# Patient Record
Sex: Female | Born: 1960 | Race: White | Hispanic: No | Marital: Single | Smoking: Never smoker
Health system: Southern US, Community
[De-identification: ages and names within clinical notes are randomized; demographics above are authoritative.]

## PROBLEM LIST (undated history)

## (undated) DIAGNOSIS — M47817 Spondylosis without myelopathy or radiculopathy, lumbosacral region: Secondary | ICD-10-CM

## (undated) DIAGNOSIS — M719 Bursopathy, unspecified: Secondary | ICD-10-CM

## (undated) DIAGNOSIS — J302 Other seasonal allergic rhinitis: Secondary | ICD-10-CM

## (undated) DIAGNOSIS — M961 Postlaminectomy syndrome, not elsewhere classified: Secondary | ICD-10-CM

## (undated) DIAGNOSIS — M51369 Other intervertebral disc degeneration, lumbar region without mention of lumbar back pain or lower extremity pain: Secondary | ICD-10-CM

## (undated) DIAGNOSIS — G43909 Migraine, unspecified, not intractable, without status migrainosus: Secondary | ICD-10-CM

## (undated) DIAGNOSIS — M5136 Other intervertebral disc degeneration, lumbar region: Secondary | ICD-10-CM

## (undated) DIAGNOSIS — G894 Chronic pain syndrome: Secondary | ICD-10-CM

## (undated) DIAGNOSIS — G8929 Other chronic pain: Secondary | ICD-10-CM

## (undated) DIAGNOSIS — M75102 Unspecified rotator cuff tear or rupture of left shoulder, not specified as traumatic: Secondary | ICD-10-CM

## (undated) HISTORY — PX: APPENDECTOMY: SHX54

## (undated) HISTORY — PX: ABDOMINAL HYSTERECTOMY: SHX81

## (undated) HISTORY — DX: Other intervertebral disc degeneration, lumbar region: M51.36

## (undated) HISTORY — DX: Unspecified rotator cuff tear or rupture of left shoulder, not specified as traumatic: M75.102

## (undated) HISTORY — DX: Other intervertebral disc degeneration, lumbar region without mention of lumbar back pain or lower extremity pain: M51.369

## (undated) HISTORY — DX: Postlaminectomy syndrome, not elsewhere classified: M96.1

## (undated) HISTORY — DX: Chronic pain syndrome: G89.4

## (undated) HISTORY — DX: Spondylosis without myelopathy or radiculopathy, lumbosacral region: M47.817

## (undated) HISTORY — DX: Bursopathy, unspecified: M71.9

## (undated) HISTORY — PX: REFRACTIVE SURGERY: SHX103

## (undated) HISTORY — DX: Other seasonal allergic rhinitis: J30.2

## (undated) HISTORY — PX: WISDOM TOOTH EXTRACTION: SHX21

## (undated) HISTORY — PX: NASAL SEPTUM SURGERY: SHX37

## (undated) HISTORY — PX: TUMOR REMOVAL: SHX12

## (undated) HISTORY — DX: Migraine, unspecified, not intractable, without status migrainosus: G43.909

## (undated) HISTORY — DX: Other chronic pain: G89.29

---

## 1999-07-14 ENCOUNTER — Encounter: Admission: RE | Admit: 1999-07-14 | Discharge: 1999-07-23 | Payer: Self-pay | Admitting: Occupational Medicine

## 2000-02-17 ENCOUNTER — Encounter: Payer: Self-pay | Admitting: Obstetrics and Gynecology

## 2000-02-17 ENCOUNTER — Ambulatory Visit (HOSPITAL_COMMUNITY): Admission: RE | Admit: 2000-02-17 | Discharge: 2000-02-17 | Payer: Self-pay | Admitting: Obstetrics and Gynecology

## 2000-03-17 ENCOUNTER — Ambulatory Visit (HOSPITAL_COMMUNITY): Admission: RE | Admit: 2000-03-17 | Discharge: 2000-03-17 | Payer: Self-pay | Admitting: Gastroenterology

## 2000-05-11 ENCOUNTER — Ambulatory Visit (HOSPITAL_BASED_OUTPATIENT_CLINIC_OR_DEPARTMENT_OTHER): Admission: RE | Admit: 2000-05-11 | Discharge: 2000-05-11 | Payer: Self-pay | Admitting: Allergy and Immunology

## 2001-02-09 HISTORY — PX: GANGLION CYST EXCISION: SHX1691

## 2001-02-22 ENCOUNTER — Ambulatory Visit (HOSPITAL_COMMUNITY): Admission: RE | Admit: 2001-02-22 | Discharge: 2001-02-22 | Payer: Self-pay | Admitting: Obstetrics and Gynecology

## 2001-02-22 ENCOUNTER — Encounter: Payer: Self-pay | Admitting: Obstetrics and Gynecology

## 2001-12-11 ENCOUNTER — Encounter: Payer: Self-pay | Admitting: Emergency Medicine

## 2001-12-11 ENCOUNTER — Encounter (INDEPENDENT_AMBULATORY_CARE_PROVIDER_SITE_OTHER): Payer: Self-pay | Admitting: *Deleted

## 2001-12-11 ENCOUNTER — Inpatient Hospital Stay (HOSPITAL_COMMUNITY): Admission: EM | Admit: 2001-12-11 | Discharge: 2001-12-13 | Payer: Self-pay | Admitting: Emergency Medicine

## 2002-10-30 ENCOUNTER — Ambulatory Visit (HOSPITAL_COMMUNITY): Admission: RE | Admit: 2002-10-30 | Discharge: 2002-10-30 | Payer: Self-pay | Admitting: Orthopedic Surgery

## 2002-10-30 ENCOUNTER — Encounter: Payer: Self-pay | Admitting: Orthopedic Surgery

## 2002-11-30 ENCOUNTER — Ambulatory Visit (HOSPITAL_BASED_OUTPATIENT_CLINIC_OR_DEPARTMENT_OTHER): Admission: RE | Admit: 2002-11-30 | Discharge: 2002-11-30 | Payer: Self-pay | Admitting: Orthopedic Surgery

## 2004-09-22 ENCOUNTER — Encounter: Admission: RE | Admit: 2004-09-22 | Discharge: 2004-09-22 | Payer: Self-pay | Admitting: Otolaryngology

## 2005-03-12 ENCOUNTER — Other Ambulatory Visit: Admission: RE | Admit: 2005-03-12 | Discharge: 2005-03-12 | Payer: Self-pay | Admitting: Obstetrics and Gynecology

## 2005-03-26 ENCOUNTER — Ambulatory Visit (HOSPITAL_COMMUNITY): Admission: RE | Admit: 2005-03-26 | Discharge: 2005-03-26 | Payer: Self-pay | Admitting: Obstetrics and Gynecology

## 2006-05-27 ENCOUNTER — Ambulatory Visit (HOSPITAL_COMMUNITY): Admission: RE | Admit: 2006-05-27 | Discharge: 2006-05-27 | Payer: Self-pay | Admitting: Obstetrics and Gynecology

## 2006-12-07 ENCOUNTER — Encounter (INDEPENDENT_AMBULATORY_CARE_PROVIDER_SITE_OTHER): Payer: Self-pay | Admitting: Obstetrics and Gynecology

## 2006-12-07 ENCOUNTER — Ambulatory Visit (HOSPITAL_COMMUNITY): Admission: RE | Admit: 2006-12-07 | Discharge: 2006-12-08 | Payer: Self-pay | Admitting: Obstetrics and Gynecology

## 2008-02-10 HISTORY — PX: LUMBAR LAMINECTOMY: SHX95

## 2008-03-16 ENCOUNTER — Encounter: Admission: RE | Admit: 2008-03-16 | Discharge: 2008-03-16 | Payer: Self-pay | Admitting: Family Medicine

## 2008-04-23 ENCOUNTER — Inpatient Hospital Stay (HOSPITAL_COMMUNITY): Admission: RE | Admit: 2008-04-23 | Discharge: 2008-04-27 | Payer: Self-pay | Admitting: Neurosurgery

## 2008-09-08 ENCOUNTER — Emergency Department (HOSPITAL_COMMUNITY): Admission: EM | Admit: 2008-09-08 | Discharge: 2008-09-08 | Payer: Self-pay | Admitting: Emergency Medicine

## 2009-02-03 ENCOUNTER — Emergency Department (HOSPITAL_COMMUNITY): Admission: EM | Admit: 2009-02-03 | Discharge: 2009-02-03 | Payer: Self-pay | Admitting: Emergency Medicine

## 2010-01-21 ENCOUNTER — Encounter
Admission: RE | Admit: 2010-01-21 | Discharge: 2010-01-21 | Payer: Self-pay | Source: Home / Self Care | Attending: Unknown Physician Specialty | Admitting: Unknown Physician Specialty

## 2010-05-22 LAB — CBC
Hemoglobin: 11.7 g/dL — ABNORMAL LOW (ref 12.0–15.0)
MCHC: 35.5 g/dL (ref 30.0–36.0)
MCHC: 34.9 g/dL (ref 30.0–36.0)
MCV: 89.6 fL (ref 78.0–100.0)
MCV: 90 fL (ref 78.0–100.0)
Platelets: 260 10*3/uL (ref 150–400)
RBC: 3.67 MIL/uL — ABNORMAL LOW (ref 3.87–5.11)
RDW: 12 % (ref 11.5–15.5)

## 2010-05-22 LAB — BASIC METABOLIC PANEL
BUN: 10 mg/dL (ref 6–23)
CO2: 32 mEq/L (ref 19–32)
Calcium: 9.2 mg/dL (ref 8.4–10.5)
Glucose, Bld: 120 mg/dL — ABNORMAL HIGH (ref 70–99)
Potassium: 4 mEq/L (ref 3.5–5.1)
Sodium: 139 mEq/L (ref 135–145)

## 2010-05-22 LAB — TYPE AND SCREEN: ABO/RH(D): A POS

## 2010-06-24 NOTE — Op Note (Signed)
NAME:  Tracy Fox, Tracy Fox                    ACCOUNT NO.:  [id number]   MEDICAL RECORD NO.:  [id number]          PATIENT TYPE:  INP   LOCATION:  3034                         FACILITY:  [facility]   PHYSICIAN:  Cristi Loron, M.D.DATE OF BIRTH:  23-Jan-1961   DATE OF PROCEDURE:  04/23/2008  DATE OF DISCHARGE:                               OPERATIVE REPORT   BRIEF HISTORY:  The patient is a 50 year old white female who suffered  from back and leg pain.  She has failed medical management and worked up  with lumbar MRI and x-rays which demonstrated the patient had a grade 2  spondylolisthesis at L5-S1 with foraminal stenosis, disk degeneration,  etc.  I discussed the various treatment with the patient including  surgery.  She has weighed the risks, benefits, and alternatives of  surgery and decided to proceed with a lumbar decompression with  instrumentation and fusion.   PREOPERATIVE DIAGNOSES:  L5-S1 spondylolisthesis, disk degeneration,  spinal stenosis, lumbago, and lumbar radiculopathy.   POSTOPERATIVE DIAGNOSES:  L5-S1 spondylolisthesis, disk degeneration,  spinal stenosis, lumbago, and lumbar radiculopathy.   PROCEDURES:  1. L5 laminectomy/Gill procedure.  2. Decompressive bilateral L5-S1 nerve roots; L5-S1 posterior lumbar      fusion with local morselized autograft bone and Actifuse/Vitoss      bone graft extender; insertion of L5-S1 bilateral interbody      prosthesis (Capstone PEEK interbody prosthesis); L5-S1 posterior      nonsegmental instrumentation with Legacy titanium pedicle screws      and rods; L5-S1 posterolateral arthrodesis with local morselized      autograft bone, Actifuse and Vitoss bone graft extenders.   SURGEON:  Cristi Loron, MD   ASSISTANT:  Danae Orleans. Venetia Maxon, MD   ANESTHESIA:  Endotracheal.   ESTIMATED BLOOD LOSS:  150 mL.   SPECIMEN:  None.   DRAINS:  None.   COMPLICATIONS:  None.   DESCRIPTION OF PROCEDURE:  The patient was brought to operating  room by  the Anesthesia Team.  General endotracheal anesthesia was induced.  The  patient was turned to prone position on Wilson frame.  The lumbosacral  region was then prepared with Betadine scrub with Betadine solution.  Sterile drapes were applied.  I then injected the area to be incised  with Marcaine with epinephrine solution.  I used a scalpel to make a  linear and midline incision over the L5-S1 interspace.  I used  electrocautery to perform a bilateral subperiosteal dissection exposing  spinous process and lamina of L3, L4, L5, and the upper sacrum.  We  obtained intraoperative radiograph to confirm our location.  I then  inserted a Versatrac retractor for exposure.   We began the decompression by incising the interspinous ligament at L4-  L5 and L5-S1 with a scalpel.  Of note, because of the patient's severe  foraminal stenosis and lateral recess stenosis, a wide lateral  decompression was required with removal of the medial facets and wide  foraminotomy for removal of osteophytes etc.  To decompress the  bilateral L5-S1 nerve roots, we removed the L5 lamina.  The lamina was  free-floating with bilateral pars defects.  We performed  foraminotomies of the bilateral L5-S1 nerve roots and completed  decompression.   We now turned attention to the posterior lumbar interbody fusion.  Incised the L5-S1 intervertebral disk bilaterally with 15-blade scalpel  and performed a partial intervertebral dissection with the pituitary  forceps.  The disk space was quite spondylotic.  In preparation for  posterior lumbar interbody fusion, we removed the soft tissue vertebral  endplates using the pituitary forceps and Epstein curettes.  We used the  trial spacers and determined to use a 8 x 22 mm PEEK interbody  prosthesis.  We prefilled each prosthesis with a combination of local  autograft bone, Actifuse, and Vitoss bone graft extenders.  We inserted  the prosthesis into the interspace, of  course after retracting neural  structures out of harm's way.  There was good snug fit of the prosthesis  bilaterally.  I should mention we also filled in ventrally, medially,  and laterally to the prosthesis with Vitoss, Actifuse, and local  autograft bone.  This completed the posterior lumbar interbody fusion.   We now turned our attention to the posterior instrumentation.  Under  fluoroscopic guidance, we cannulated the bilateral L5-S1 pedicles with  bone probe.  We tapped the pedicles with a 5.5 mm tap.  We probed inside  of the tapped pedicles to rule out cortical breaches.  We inserted 6.5 x  45 mm pedicle screws bilaterally at L5 and 6.5 x 40 bilaterally at S1.  Under fluoroscopic guidance, we got good purchase.  We then palpated  along the medial aspect of L5-S1 pedicles and noted there was no  cortical breaches and nerve roots were not injured.  We then connected  the unilateral pedicle screws with a lordotic rod.  We secured the rod  in place with caps which we tightened appropriately completing the  instrumentation.   We now turned our attention to posterior lateral arthrodesis.  We used a  high-speed drill to decorticate the transverse process of L5 as well as  the upper sacrum.  We laid a combination of local morselized autograft  bone, Actifuse, and Vitoss bone graft extenders over these decorticated  posterolateral structures completing the posterolateral arthrodesis.   We then inspected the thecal sac and at L5-S1 nerve roots noted they  were well decompressed.  We obtained hemostasis with bipolar  electrocautery.  We irrigated the wound out with bacitracin solution.  We removed the retractors.  I then reapproximated the patient's  thoracolumbar fascia with interrupted #1 Vicryl suture, subcutaneous  tissue with interrupted 2-0 Vicryl suture.  The incision was then coated  with Dermabond and the drapes were removed.  The patient was  subsequently turned to supine  position where she was extubated by the  Anesthesia Team and transported to the Postanesthesia Care Unit in  stable condition.  All sponge, instrument, and needle counts were  correct at the end of this case.      Cristi Loron, M.D.  Electronically Signed     JDJ/MEDQ  D:  04/23/2008  T:  04/24/2008  Job:  [id number]

## 2010-06-24 NOTE — H&P (Signed)
NAME:  Tracy Fox, Tracy Fox                    ACCOUNT NO.:  [id number]   MEDICAL RECORD NO.:  [id number]          PATIENT TYPE:  AMB   LOCATION:  DAY                          FACILITY:  [facility]   PHYSICIAN:  Sandra A. Rivard, M.D. DATE OF BIRTH:  21-Dec-1960   DATE OF ADMISSION:  12/07/2006  DATE OF DISCHARGE:                              HISTORY & PHYSICAL   HISTORY OF PRESENT ILLNESS:  Tracy Fox is a 50 year old single white  female, gravida 0, who presents for a robotically-assisted laparoscopic  hysterectomy with bilateral salpingo-oophorectomy because of severe  dysmenorrhea, menorrhagia, symptomatic uterine fibroids and a family  history of ovarian cancer.  Over the past year the patient began to have  severe dysmenorrhea which radiated to her back, whose pain was rated at  a 10/10 on a 10-point pain scale.  This discomfort would begin a few  days before her period and extend throughout her 7-day flow.  The  patient gives a history of what she describes as heavy menstrual  periods, which last for 7 days and require her to change a pad every 2  hours and was often accompanied by clots.  She received some relief from  nonsteroidal anti-inflammatory medications and occasionally used  narcotic pain medicines; however, her pain was never consistently  controlled.  A pelvic ultrasound in September 2008 showed a uterus  measuring 8.5 x 5.1 x 6.5 cm with two posterior intramural fibroids  measuring 3.4 cm and 2.6 cm, respectively.  The patient's ovaries on  this study appeared normal.  It was at this point the patient was placed  on oral contraceptives in an effort to better control her pain and  hopefully have some impact on her bleeding.  The patient's dysmenorrhea  improved somewhat, decreasing from a 10/10 to a 4-6/10 on a 10-point  pain scale, but her menstrual flow required the change of a tampon with  a pad every 3-4 hours for the first 3-4 days of her 7-day flow.  She  denies any urinary tract  symptoms, changes in bowel habits, vaginitis  symptoms or fever.  The patient was given a review of both medical and  surgical options for managing her pain and menstrual flow.  Due to the  fact that her mother died at age 30 due to ovarian cancer and the  limited responses she has thus far had to medical management, the  patient wishes to proceed with definitive therapy in the form of  hysterectomy with bilateral salpingo-oophorectomy.   PAST MEDICAL HISTORY:  OB history:  Gravida 0.   GYN history:  Menarche in her early teens.  The patient uses  abstinence as her method of contraception, though she does take  Microgestin 1.5/30 to help with her menstrual flow.  The patient's last  menstrual period was November 08, 2006.  She denies any sexually-  transmitted diseases or history of abnormal Pap smears.  Her last Pap  smear and mammogram were both in April 2008 and they were both normal.   Medical history:  1. Environmental allergies.  2. Migraine headaches.  3. Gastroesophageal reflux disease.  4. Irritable bowel syndrome.  5. Reactive airway disease.   SURGICAL HISTORY:  1. 1993, deviated septum repair.  2. 1998, right humeral tumor surgery (twice).  3. 2003, right ganglion cyst repair.  4. 2004, appendectomy.  5. 2007, LASIK eye surgery.   She denies any problems with anesthesia or any history of blood  transfusions.   FAMILY HISTORY:  Ovarian cancer (mother age 44/deceased at 89), colon  cancer, hypertension, stroke, migraines.   HABITS:  She occasionally consumes alcohol.  Denies any tobacco or  illicit drug use.   SOCIAL HISTORY:  The patient is single and she works at [employer].  However, she currently is leave of absence.   CURRENT MEDICATIONS:  1. Zyrtec 10 mg daily as needed.  2. Mucinex daily as needed.  3. Microgestin 1.5/30 one tablet daily.  4. Albuterol inhaler as needed.  5. Singulair as needed.  6. Multivitamin one tablet daily.   ALLERGIES:   DARVOCET, which causes severe itching; LATEX and PREDNISONE,  both of which cause her mouth to swell.   REVIEW OF SYSTEMS:  The patient does have occasional wheezes but denies  any chest pain, shortness of breath, headache, vision changes and,  except as is mentioned in history of present illness, the patient's  review of systems is negative.   PHYSICAL EXAM:  VITAL SIGNS:  Blood pressure 114/70 pulse is 84, weight  190, height 5 feet 5 inches tall.  GENERAL EXAM:  Neck is supple without masses.  There is no thyromegaly  or cervical adenopathy.  HEART:  Regular rate and rhythm.  There is no murmur.  LUNGS:  Clear.  There are no wheezes, rales or rhonchi.  BACK:  No CVA tenderness.  ABDOMEN:  Without tenderness, guarding, rebound or organomegaly.  EXTREMITIES:  No clubbing, cyanosis or edema.  PELVIC:  EG, BUS is within normal limits.  Vagina is rugous.  Cervix is  nontender without lesions.  Uterus appears normal size, shape and  consistency without tenderness.  Adnexa without tenderness or masses.  Rectovaginal without tenderness.  The patient does have a small  hemorrhoid.   IMPRESSION:  1. Severe dysmenorrhea.  2. Symptomatic uterine fibroids.  3. Menorrhagia.  4. First degree relative, ovarian cancer (age 74).   DISPOSITION:  A discussion was held with the patient regarding the  indication for her procedure along with its risks, which include but are  not limited to reaction to anesthesia, damage to adjacent organs,  infection, excessive bleeding, and the possibility that an open  abdominal incision may be required to complete her surgery.  The patient  further understands that she may have transient postoperative facial  edema, that her surgery will take longer than traditional open  hysterectomy with bilateral salpingo-oophorectomy, that her hospital  stay is expected to be 1-2 days and that her return to work should be  within 2-3 weeks.  The patient verbalized  understanding of these risks  and has  consented to proceed with the robot-assisted laparoscopic hysterectomy  on December 07, 2006, at 730 a.m., at [facility].  The patient was given a copy of the MiraLax bowel prep to be completed  24 hours prior to her procedure.      Elmira J. Adline Peals.      Crist Fat Rivard, M.D.  Electronically Signed    EJP/MEDQ  D:  12/03/2006  T:  12/04/2006  Job:  [id number]

## 2010-06-24 NOTE — Op Note (Signed)
NAME:  Zollner, Fawne                    ACCOUNT NO.:  [id number]   MEDICAL RECORD NO.:  [id number]          PATIENT TYPE:  AMB   LOCATION:  DAY                          FACILITY:  [facility]   PHYSICIAN:  Sandra A. Rivard, M.D. DATE OF BIRTH:  24-Feb-1960   DATE OF PROCEDURE:  12/07/2006  DATE OF DISCHARGE:                               OPERATIVE REPORT   PREOPERATIVE DIAGNOSES:  1. Severe dysmenorrhea with symptomatic fibroids.  2. Menorrhagia.  3. Family history of ovarian cancer.   POSTOPERATIVE DIAGNOSES:  1. Severe dysmenorrhea with symptomatic fibroids.  2. Menorrhagia.  3. Family history of ovarian cancer.  4. Pelvic adhesions.  5. Endometriosis.   ANESTHESIA:  General.   PROCEDURE:  Robotic assisted laparoscopic hysterectomy with bilateral  salpingo-oophorectomy and lysis of adhesions.   SURGEON:  Crist Fat. Rivard, M.D.   ASSISTANTMarquis Lunch. Lowell Guitar, P.A.   ESTIMATED BLOOD LOSS:  200 mL.   PROCEDURE:  After being informed of the planned procedure with possible  complications including bleeding, infection, injury to bowel, bladder or  ureters and need for laparotomy, informed consent is obtained.  The  patient is taken to OR #10, given general anesthesia with endotracheal  intubation without complication.  She is placed in the lithotomy  position on the sticky mattress,  knee-high sequential compression  devices, arms padded and tucked by her sides and chest taped to the  table.   She is prepped and draped in a sterile fashion and a silicone Foley  catheter is inserted in her bladder.  Pelvic exam reveals a anteverted  uterus, slightly bulky, mobile and two normal adnexae.  A weighted  speculum is inserted, anterior lip of the cervix was grasped with a  tenaculum forceps and we dilate the cervix using Hegar dilator until  #25.  Uterus is then sounded at 9 cm and we use a 8-mm Rumi intrauterine  manipulator which is inserted easily with a 3-mm coring and a vaginal  occluder.  The Rumi balloon is inflated with 4 mL of saline.  The coring  is sutured to the cervix using 0  Vicryl and the cervix is regrasped  with a Jacobs forceps.   We then decide our trocar placement and the camera will be placed right  above the umbilicus and this area is infiltrated with 3 mL of Marcaine  0.25 and we perform a vertical 10-mm incision which was brought down  sharply to the fascia.  The fascia is identified, grasped with Kocher  forceps and incised with Mayo scissors and peritoneum is entered  bluntly.  A 0 Vicryl pursestring suture is placed on the fascia and a 10-  mm Hassan trocar is easily inserted, held in place with a pursestring  suture.  This allows [place] to insufflate pneumoperitoneum with CO2 at a  maximum pressure of 15 mmHg.  The camera is inserted.  Observation:  We  see small adhesions between the colon and the right abdominal wall where  the patient has had a previous appendectomy, liver and gallbladder are  seen and normal.  The uterus  is difficult to mobilize currently.  It  appears bulky, but the posterior cul-de-sac is obliterated.  We are  unable to see the adnexae at this time.   We then decide the placement of all our other trocars and we place two 8-  mm robotic trocars on the left after infiltrating each side with 2 mL of  Marcaine 0.25 and we place one 8-mm robotic trocar on the right after  infiltrating the side with 2 mL of Marcaine 0.25 and finally we put a 10  mm patient side assistant trocar on the right flank after infiltrating  with 3 mL of Marcaine 0.25.  Before placing the 10-mm trocar, we proceed  with sharp lysis of adhesion of the bowel with the right abdominal wall  to allow for easy placement of the 10-mm trocar.   We can now dock the robot and place in arm #1 a monopolar scissor, arm  #2 gyrus PK forceps and arm #3 a Cobra forceps.  This allows [place] to  mobilize and further evaluate the anatomy which now reveals severe  pelvic  endometriosis with complete obliteration of the posterior cul-de-  sac.  The right adnexa is stuck to the posterior lower uterine segment  of the uterus.  The left adnexa is normal.   We then start console time at 9:10 a.m.   The right infundibulopelvic ligament is identified, grasped with PK  forceps and cauterized while we have the right ureter under direct  visualization.  This is done systematically until the right  infundibulopelvic ligament can be section.  With blood flow reduced to  the right adnexa, we are able to dissect it away from the lower uterine  segment so that we will have access to the retroperitoneum.  The right  round ligament is then cauterized and sectioned and the anterior sheath  of the broad ligament is then incised so that we can safely dissect  bladder away from the vaginal from the anterior vaginal fornix.  This is  done sharply and bluntly.  This also gives [place] a pretty good access to  the retroperitoneum and we proceed with skeletonization of the vessels,  keeping the ureter under direct visualization.  Once the vessels are  skeletonized, we can cauterize them with PK forceps.  We then put our  attention on the left of the uterus and identify the left  infundibulopelvic ligament away from the left ureter which is kept under  direct visualization.  We cauterized that ligament and section it  freeing the adnexa.  The left round ligament is then cauterized and  sectioned and the anterior sheath of the broad ligament is then incised  so that we can complete our dissection of the bladder away from the  anterior vaginal fornix.  We have access to the posterior sheath of the  broad ligament now which is sharply dissected in order to completely  skeletonize the uterine artery on that side.  Again with pressure on the  coring and direct visualization of the ureter, we are able to safely  cauterize the vascular bundle on the left using PK forceps and that  bundle is  sectioned.  We have to put our attention on the posterior cul-  de-sac now and we sharply dissect the rectum away from the posterior cul-  de-sac being a little high on our dissection to leave a little bit of  uterine serosa on the rectum.  With pressure on the coring we can now  identify that  we can safely open the posterior cul-de-sac away from the  rectum.   The vaginal occluder is inflated and we now proceed with colpotomy,  using monopolar scissors we open the anterior cul-de-sac guiding  ourselves with the coring and we can do a circumferential colpotomy with  the posterior cul-de-sac being incised and that way we can free the  whole uterus with its two tubes and two ovaries.  The specimen is  delivered vaginally without difficulty.   Instruments are then removed to have a suture cut in arm #1, needle  holder in arm #2 and PK forceps in arm #3.  We proceed with closure of  the vaginal cuff using figure-of-eight stitches of 0 Vicryl.  We then  irrigate profusely and note a satisfactory hemostasis.  There is a,  however, a small area of the sigmoid fatty tissue that has been  traumatized during the retraction for the surgery and we irrigate  profusely, cauterize all the sites that are available for cauterization  away from the sigmoid colon itself until we have a satisfactory  hemostasis.  We can now visualize both ureters with an easy peristaltism  on the left and no dilatation.  On the right, there is no dilatation,  peristaltism is more difficult to observe because of the inflammatory  process of the endometriosis.  Nevertheless we do note peristaltism.   We then deposit a full sheet of Interceed in the cul-de-sac to cover our  section.  And we also use FloSeal on the sigmoid colon fat to stop the  mild oozing that is still is visible.   Hemostasis is now deemed adequate and console time is completed in 2  hours and 15 minutes.   We then proceed with undocking of the robotic  arms and removal of all  robotic instruments.  Under direct visualization, we remove all trocars  and we evacuate the pneumoperitoneum.  The supraumbilical fascial  incision is closed with the previously placed pursestring suture after  making sure no bowel is protruding and the fascia of the right patient  side trocar is closed with a figure-of-eight stitch of 0 Vicryl.  Skin  is closed with subcuticular suture of 4-0 Monocryl and Steri-Strips on  all incisions.   The vaginal occluder is removed and vaginal and vulvar exam reveals a  small vulvar tear at the posterior fourchette which is closed with a  figure-of-eight stitch of 3-0 Vicryl.   Instruments and sponge count is complete x2.  Estimated blood loss is  200 mL.  The procedure is very well tolerated by the patient who is  taken to recovery room in a well and stable condition.   SPECIMEN:  Uterus, tubes and ovaries sent to pathology, weighed in the  operating room at 208 grams.      Crist Fat Rivard, M.D.  Electronically Signed     SAR/MEDQ  D:  12/07/2006  T:  12/07/2006  Job:  [id number]

## 2010-06-27 NOTE — Procedures (Signed)
[facility]  PatientTYREONA, PANJWANI                             MRN: [id number] Proc. Date: 03/17/00 Adm. Date:  [id number] Attending:  Orland Mustard CC:         Al Decant. Janey Greaser, M.D.   Procedure Report  PROCEDURE:  Colonoscopy.  MEDICATIONS:  Fentanyl 70 mcg, Versed 7 mg IV.  ENDOSCOPE:  Pediatric Olympus video colonoscope.  INDICATION:  Strong family history of colon cancer.  DESCRIPTION OF PROCEDURE:  The procedure had been explained to the patient and consent obtained.  With the patient in the left lateral decubitus position, the Olympus adult video colonoscope was inserted and advanced under direct visualization.  The prep was excellent.  We advanced to the cecum without difficulty.  The ileocecal valve was identified, right lower quadrant was transilluminated.  The scope was withdrawn.  Cecum, ascending colon, hepatic flexure, transverse colon. splenic flexure, descending and sigmoid colon seen well upon removal.  No significant diverticular disease.  No polyps seen. Scope withdrawn.  The patient tolerated the procedure well.  ASSESSMENT:  No evidence of colon polyps.  PLAN:  Due to her family history, we will recommend repeating in five years. DD:  03/17/00 TD:  03/17/00 Job: [id number]

## 2010-06-27 NOTE — Discharge Summary (Signed)
NAME:  Tracy Fox, Tracy Fox                    ACCOUNT NO.:  [id number]   MEDICAL RECORD NO.:  [id number]          PATIENT TYPE:  INP   LOCATION:  3034                         FACILITY:  [facility]   PHYSICIAN:  Cristi Loron, M.D.DATE OF BIRTH:  18-May-1960   DATE OF ADMISSION:  04/23/2008  DATE OF DISCHARGE:  04/27/2008                               DISCHARGE SUMMARY   BRIEF HISTORY:  The patient is a 50 year old with a history of back and  leg pain and has failed medical management and worked up with a lumbar  MRI  which demonstrated a  spondylolisthesis at L5-S1. I discussed the  various treatment options including surgery.  The patient weighed the  risks, benefits, and alternatives of the surgery and decided to proceed  with a lumbar decompression instrumentation and fusion.   For further details of this admission, please refer to typed history and  physical.   HOSPITAL COURSE:  I admitted the patient to [facility] on April 23, 2008.  On the day of admission, I performed an L4-5 decompression,  instrumentation and fusion.  The surgery went well (for full details of  this operation, please refer to typed operative note).   POSTOPERATIVE COURSE:  The patient's postoperative course was  unremarkable and the patient was discharged to home on postoperative day  #4.   DISCHARGE INSTRUCTIONS:  The patient was instructed to follow up with me  in 4 weeks.   DISCHARGE PRESCRIPTIONS:  The patient was given a written prescription  for:  1. Percocet 10/325 #100 one p.o. q.4 h. for pain.  2. Valium 5 mg #50 one p.o. q.6 h. for muscle spasm.  3. Fiorinal 50 mg 1-2 p.o. q.4-6 h. Maximum 6 a day.   FINAL DIAGNOSES:  L5-S1 spondylolisthesis with degenerative spinal  stenosis, lumbago, and lumbar radiculopathy.   PROCEDURE PERFORMED:  L5 decompressive laminectomy to decompress the  bilateral L5 as well as S1 nerve roots; L5-S1 posterior lumbar interbody  fusion with local morselized  autograft bone and Actifuse/Vitoss bone  graft  extender; insertion of L5-S1 bilateral interbody prosthesis (Capstone  PEEK interbody prosthesis); L5-S1 posterior nonsegmental instrumentation  with Legacy and titanium pedicle screws and rods; L5-S1 posterolateral  arthrodesis with local morselized autograft bone and Actifuse/Vitoss  bone graft extender.      Cristi Loron, M.D.  Electronically Signed     Cristi Loron, M.D.  Electronically Signed    JDJ/MEDQ  D:  06/07/2008  T:  06/07/2008  Job:  [id number]

## 2010-06-27 NOTE — H&P (Signed)
NAME:  Tracy Fox, Tracy Fox                              ACCOUNT NO.:  [id number]   MEDICAL RECORD NO.:  [id number]                   PATIENT TYPE:  EMS   LOCATION:  MINO                                 FACILITY:  [facility]   PHYSICIAN:  Sandria Bales. Ezzard Standing, Fox.D.               DATE OF BIRTH:  1960-07-30   DATE OF ADMISSION:  12/11/2001  DATE OF DISCHARGE:                                HISTORY & PHYSICAL   HISTORY OF PRESENT ILLNESS:  This is a 50 year old white female who is a  patient of Dr. Jamesetta Geralds of [facility], who had  abdominal pain which began on the evening of 12/09/2001.  She thought it was  just a GI bug.  She actually had very similar symptoms about a month  earlier, and they resolved on their own.  However, she felt worse on  Saturday, November 1, and finally had a friend bring her to the hospital on  Sunday, November 2.  She had a lot of abdominal pain.  She had some nausea  and vomiting yesterday.  She had a couple of loose bowel movements yesterday  but no bowel movements since yesterday afternoon.   She has no history of peptic ulcer disease, liver disease, pancreatic  disease.  She did have some irritable bowel.   ALLERGIES:  DARVOCET which leads to rash.  PREDNISONE makes her mouth swell.   CURRENT MEDICATIONS:  Include only Zyrtec which she is on for allergies and  takes year round.  She came off of Depakote about a month ago for migraine  headaches.   REVIEW OF SYSTEMS:  NEUROLOGIC:  She has a history of migraine headaches for  about seven years, sees Dr. Amelia Jo at [address] as  her neurologist.  She had been on Depakote and recently came off of this  last month and has done pretty good.  PULMONARY:  She was exposed as both  parents smoked cigarettes, and she has had some bronchitis during much of  her adult life exacerbated by allergies.  She has had two bouts with  pleurisy, was not hospitalized for any of these bouts. She  does not smoke  cigarettes.  CARDIAC:  She has no history of heart disease, chest pain, or  hypertension. GASTROINTESTINAL:  See History of Present Illness.  UROLOGIC:  No kidney stones or kidney infection.  GYN: Her mother started having  menopause early.  She has had irregularity to her periods with the last  period being about three weeks ago.   SOCIAL HISTORY:  She has a friend, Ottis Stain, who is in the room with  her.   PHYSICAL EXAMINATION:  VITAL SIGNS:  Temperature 97.3, blood pressure  135/83, pulse 114, respirations 20.  GENERAL:  She is a well-nourished, pleasant white female, alert and  cooperative with physical exam.  She has a somewhat  flushed face.  HEENT:  Unremarkable with no obvious oral lesions.  NECK:  Supple without thyromegaly.  She has no cervical or axillary  adenopathy.  BREASTS:  She has had annual mammograms.  I did not examine her breasts.  LUNGS:  Clear to auscultation.  HEART:  Regular rate and rhythm.  I hear no murmur or rub.  ABDOMEN:  Decreased bowel sounds with guarding or rebound in right lower  quadrant.  She is mildly distended.  GU:  She had a gynecologic exam in the emergency room by the P.A.  I did not  repeat this, but apparently she was told she has irritation or redness.  EXTREMITIES:  Good strength in all four extremities.  NEUROLOGIC:  Grossly intact.   LABORATORY DATA:  White blood cell count 14,600, hemoglobin 14.2, hematocrit  41.  Urinalysis showed many bacteria.  Sodium 133, potassium 3.7, chloride  99, CO2 24, glucose 124.  Her liver functions are all within normal limits.  Lipase was 18.  Urine pregnancy was negative.   CT scan showed abnormal appendix with inflammation around the appendix  with  question raised of that of a local perforation.  She also has some fluid in  her pelvis which could be either gynecological or secondary to this  appendicitis.   IMPRESSION:  1. Appendicitis by physical exam and CT scan.   Discussed with the patient     and will proceed with surgery today.  Will attempt this laparoscopically.     Discussed the indications and potential complications including bleeding,     infection, open surgery.  Her friend, Ottis Stain, was there while I     examined her or about half-way through the procedure.  2. Migraine headaches followed by Dr. Clarisse Gouge.  3. Irritation of vagina on physical exam.  I told her that since she has     an appointment with her gynecologist in December, this needs to be     followed up at that time.  It may just be due to this acute illness.  4. Allergies.  5. History of bronchitis, stable at this time.  6. History of irritable bowel syndrome.                                               Sandria Bales. Ezzard Standing, Fox.D.    DHN/MEDQ  D:  12/11/2001  T:  12/11/2001  Job:  [id number]   cc:   Kirkland Hun. Tery Sanfilippo, Fox.D.  [address], [address] [place]  [place] [address]  Fax: [phone]   Rene Kocher, Fox.D.  [address]  [place]  [place] [address]  Fax: [phone]   [facility] Gynecology

## 2010-06-27 NOTE — Op Note (Signed)
NAME:  Tracy Fox, Tracy Fox                              ACCOUNT NO.:  [id number]   MEDICAL RECORD NO.:  [id number]                   PATIENT TYPE:  INP   LOCATION:  1824                                 FACILITY:  [facility]   PHYSICIAN:  Sandria Bales. Ezzard Standing, Fox.D.               DATE OF BIRTH:  May 30, 1960   DATE OF PROCEDURE:  12/11/2001  DATE OF DISCHARGE:                                 OPERATIVE REPORT   PREOPERATIVE DIAGNOSES:  Acute appendicitis.   POSTOPERATIVE DIAGNOSES:  Acute appendicitis with questionable chronic  component.   OPERATION PERFORMED:  Laparoscopic appendectomy.   SURGEON:  Sandria Bales. Ezzard Standing, Fox.D.   ANESTHESIA:  General endotracheal with about 10 cc of 0.25% Marcaine.   INDICATIONS FOR PROCEDURE:  The patient is a 50 year old white female who  presents with signs and symptoms of acute appendicitis and elevated white  blood cell count of 14,000 and a CT scan consistent with acute appendicitis.   DESCRIPTION OF PROCEDURE:  The patient was placed in supine position and  given a general endotracheal anesthetic as supervised by Janetta Hora.  Gelene Mink, Fox.D.  She had her arms tucked to her side. A Foley catheter was  placed.  We had to dilate her urethra a little bit to get the Foley catheter  in place but the catheter passed easily and had clear urine.  Her abdomen  was prepped with Betadine solution and sterilely draped.  She was given 2 gm  of Cefotetan at the initiation of the procedure.  An infraumbilical incision  was made with sharp dissection carried down to the abdominal cavity.  A 0  degree laparoscope was inserted through a 12 mm Hasson trocar and the Hasson  trocar was secured with a 0 Vicryl suture.  The abdomen was explored.  The  right and left fallopian tubes were unremarkable.  The anterior wall of the  stomach was unremarkable.  The gallbladder that I could see was  unremarkable.  The patient in the fundus a mass in the right lower quadrant  and had fluid in her  pelvis.  I placed two additional 5 mm Ethicon trocars  in the right subcostal location and a 5 mm Ethicon trocar in the left lower  quadrant location.  I was then able to first aspirate out the kind of  contaminated looking fluid that was down in the pelvis.  It did not look  obviously purulent.  I then was able to mobilize up the appendix that lay  between the folds of her terminal ileum and a loop of small bowel in the  retroperitoneum.  It was interesting, though certainly acute in nature, it  also had a kind of chronic appearance of a kind of thickened scar around it.  I was able to mobilize it up to the appendix.  I took the mesentery of the  appendix down with a Harmonic  scalpel down to where I got to the base of the  appendix.  I then used the blue cartridge to fire across the appendix using  an Endo GIA stapler with Ethicon.  I got a good staple line, divided the  appendix, placed it in the EndoCatch bag and sent it to Pathology.   I then reinspected the gallbladder bed and the dissection down the cecum,  irrigated this out with about a liter of saline.  There was no bleeding.  There was no obvious bowel injury.  I irrigated out the pelvis and  envisioned the area of the right lobe of the liver.  I then removed the  trocars under direct visualization.  There was no bleeding at any trocar  site.  The umbilical trocar was closed with a 0 Vicryl suture.  The skin at  all three sites was closed with a 5-0 Vicryl suture, painted with tincture  of Benzoin, steri-stripped and sterilely dressed.   The patient tolerated the procedure well and was transported to the recovery  room in good condition.  Sponge and needle counts were correct at the end of  this case.                                                 Sandria Bales. Ezzard Standing, Fox.D.    DHN/MEDQ  D:  12/11/2001  T:  12/12/2001  Job:  [id number]   cc:   Kirkland Hun. Tery Sanfilippo, Fox.D.  [address] [place]  [place] [address]  Fax:  [phone]   Rene Kocher, Fox.D.  [address]  [place]  [place] [address]  Fax: [phone]

## 2010-06-27 NOTE — Op Note (Signed)
NAME:  Tracy Fox, Tracy Fox                              ACCOUNT NO.:  [id number]   MEDICAL RECORD NO.:  [id number]                   PATIENT TYPE:  AMB   LOCATION:  DSC                                  FACILITY:  [facility]   PHYSICIAN:  Katy Fitch. Naaman Plummer., Fox.D.          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 Also dorsal wrist exploration revealed a sizeable occult dorsal  ganglion deep to the capsule centered over the scapholunate interosseous  ligament and proximal pole of the capitate.   OPERATION:  1. 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  2. Open dorsal excision of ganglion overlying scapholunate interosseous     ligament and capitate.   SURGEON:  Katy Fitch. Sypher, Fox.D.   ASSISTANT:  Jonni Sanger, P.A.-C.   ANESTHESIA:  General by LMA, supervising anesthesiologist Bedelia Person, Fox.D.   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 Clinical  examination  revealed  full range of motion and normal x-rays. She did  report some mild numbness. Clinical examination did not confirm an  entrapment neuropathy and  screening electrodiagnostic studies were normal.   She did not respond to splinting, activity modification and rest. An MRI of  the wrist  was obtained looking for an occult dorsal ganglion, and indeed  she was noted to have a sizeable ganglion over the dorsal aspect of the  scapholunate ligament as interpreted by Dr. Purcell Mouton, attending radiologist.  Due to a failure to respond to nonoperative measures, she is brought to the  operating room at this time anticipating diagnostic arthroscopy to examine  her wrist for other  internal derangement, followed  by open excision of her  ganglion.   DESCRIPTION OF PROCEDURE:  Tracy Fox was brought to the operating room and  placed in the supine position on the operating table. Following  induction  of general anesthesia by LMA,  the right arm was prepped and draped with  Betadine soap and solution and sterilely draped. Following  exsanguination  of the limb with an Esmarch bandage, the arterial tourniquet was inflated to  220 mmHg. The wrist was distracted with the tower designed for wrist  arthroscopy. Finger traps were applied to the index and long finger,  traction was applied to the forearm, 10 pounds of traction was applied.   The wrist was sounded with an 18 gauge needle and distended with sterile  saline followed  by introduction of the 2.9-mm arthroscope through a 3-4  dorsal portal with blunt technique. Diagnostic arthroscopy confirmed the  degenerative triangle fibrocartilage tear. Significant synovitis was noted  at the ulnar carpal joint. A 2.9-mm suction shaver was used to debride the  synovitis and chondromalacia regions.   The lunotriquetral interosseous ligament was inspected and was found to be  normal.  The scapholunate interosseous ligament was noted to be normal. The  volar radial carpal ligaments were normal.   The triangle fibrocartilage tear was central and degenerative in nature and  compatible with her age and repetitive activities in the past. There do not  appear to be ulnar carpal abutment signs that would warrant ulnar  shortening.   The dorsal aspect of the scapholunate ligament was carefully inspected. No  pathology was visualized intraarticularly. The scope equipment was removed  followed  by a dorsal incision through the 3-4 portal. The subcutaneous  tissues were carefully divided taking care to identify the extensor  retinaculum. This was split distally, retracting the 4th dorsal compartment  tendons in an ulnar direction and the 2nd dorsal compartment tendons in a  radial direction. The capsule was carefully  explored. There was no ganglion  superficial to the capsule, however, upon dissecting between  the dorsal  radial carpal ligaments and the dorsal intercarpal ligament, a ganglion  measuring approximately 9 x 7 mm was identified directly over the  scapholunate interosseous ligament and the proximal pole of the capitate.   This was carefully excised with a rongeur and microcuret and electrocautery.  Care was taken to remove all pathologic appearing tissue. The scapholunate  interosseous ligament was discolored and rather slack in this region. This  was curetted with a microcuret. Care was taken to preserve the dorsal band  of the scapholunate interosseous ligament.   Bleeding points were electrocauterized with bipolar current followed  by  irrigation of the wound. The skin was repaired with intradermal 3-0 Prolene  and a Steri-Strip. A compressive dressing was applied with a volar plaster  splint maintaining the wrist in 5 degrees of dorsiflexion. There were no  apparent complications.                                                 Katy Fitch Naaman Plummer.,  Fox.D.    RVS/MEDQ  D:  11/30/2002  T:  11/30/2002  Job:  [id number]

## 2010-06-27 NOTE — Discharge Summary (Signed)
NAME:  Tracy Fox, Tracy Fox                              ACCOUNT NO.:  [id number]   MEDICAL RECORD NO.:  [id number]                   PATIENT TYPE:  INP   LOCATION:  5038                                 FACILITY:  [facility]   PHYSICIAN:  Sandria Bales. Ezzard Standing, Fox.D.               DATE OF BIRTH:  September 21, 1960   DATE OF ADMISSION:  12/11/2001  DATE OF DISCHARGE:  12/13/2001                                 DISCHARGE SUMMARY   DISCHARGE DIAGNOSES:  1. Acute and chronic appendicitis.  2. Migraine headaches.  3. History of allergies.  4. History of bronchitis.  5. Irritable bowel syndrome.   OPERATION:  Laparoscopic appendectomy on 12/11/01.   HISTORY OF PRESENT ILLNESS:  This is a 50 year old white female who had been  seeing Dr. Jamesetta Geralds at [facility] who had abdominal  pain which began on the evening of 12/09/01.  This pain had increased in  intensity, and she had presented to the [facility] Emergency Room.  A CT  scan obtained in the emergency room revealed appendicitis and fluid in her  pelvis with a question of a ruptured appendix.  The patient gave a history  of having some prior irritable bowel symptoms or syndrome.  She has had no  history of peptic ulcer disease, liver disease, or prior abdominal surgery.   ALLERGIES:  1. DARVOCET which leads to a rash.  2. PREDNISONE makes her swell.   MEDICATIONS:  1. Zyrtec she takes for allergies.  2. Imitrex she takes for occasional migraine headaches.   REVIEW OF SYMPTOMS:  Positive for migraine headaches, followed by Dr. Clarisse Gouge.  She has had some irritable bowel syndromes.  Has bronchitis she thinks due  to her parents smoking.   PHYSICAL EXAMINATION:  VITAL SIGNS:  Temperature is 97.3, blood pressure  135/83, pulse 110.  ABDOMEN:  Right lower quadrant tenderness, guarding, and rebound.  PELVIC:  There was also was noted by the ER physician that she has vaginal  irritation.  I did not repeat this examination, but referred her  to a  gynecologist at a later time.   HOSPITAL COURSE:  She was taken to the operating room on the day of  admission where she underwent a laparoscopic appendectomy.  Actually was  found to have a fairly chronic scarring around her appendix along with acute  disease, and I wondered whether she actually had a chronic component to her  appendicitis.  She actually gave a history of one month to six weeks prior  to presentation to having similar symptoms.   She was placed on Cefotetan as an antibiotic, kept n.p.o., and then slowly  advanced.  She is now second day postoperative, she is afebrile.  She had  some loose stools last night.  She is tolerating liquids and will be ready  for discharge.  She has headaches.  She is taking her  own Imitrex for that.   Pathology is pending at the time of dictation.   DISCHARGE MEDICATIONS:  1. Vicodin for pain.  2. Home medications.   ACTIVITY:  No driving for four to five days.  She can shower.   FOLLOWUP:  See me back in two weeks for followup.  Call for earlier  problems.                                                Sandria Bales. Ezzard Standing, Fox.D.    DHN/MEDQ  D:  12/13/2001  T:  12/13/2001  Job:  [id number]   cc:   Kirkland Hun. Tery Sanfilippo, Fox.D.  [address], [address] [place]  [place] [address]  Fax: [phone]   Rene Kocher, Fox.D.  [address]  [place]  [place] [address]  Fax: [phone]

## 2010-11-19 LAB — BASIC METABOLIC PANEL
BUN: 8
CO2: 26
Chloride: 103
Creatinine, Ser: 0.84
Glucose, Bld: 96

## 2010-11-19 LAB — CBC
HCT: 31 — ABNORMAL LOW
HCT: 39.2
MCHC: 34.4
MCV: 88.6
Platelets: 276
Platelets: 369
RDW: 12
RDW: 12.2

## 2011-01-28 ENCOUNTER — Other Ambulatory Visit: Payer: Self-pay | Admitting: Pain Medicine

## 2011-01-28 DIAGNOSIS — M545 Low back pain: Secondary | ICD-10-CM

## 2011-02-18 ENCOUNTER — Ambulatory Visit
Admission: RE | Admit: 2011-02-18 | Discharge: 2011-02-18 | Disposition: A | Discharge status: Home / Self Care | Payer: PRIVATE HEALTH INSURANCE | Source: Ambulatory Visit | Attending: Pain Medicine | Admitting: Pain Medicine

## 2011-02-18 DIAGNOSIS — M545 Low back pain: Secondary | ICD-10-CM

## 2011-02-18 MED ORDER — GADOBENATE DIMEGLUMINE 529 MG/ML IV SOLN
17.0000 mL | Freq: Once | INTRAVENOUS | Status: AC | PRN
  Administered 2011-02-18: 17 mL via INTRAVENOUS

## 2013-07-11 ENCOUNTER — Ambulatory Visit
Admission: RE | Admit: 2013-07-11 | Discharge: 2013-07-11 | Disposition: A | Discharge status: Home / Self Care | Payer: Medicare Other | Source: Ambulatory Visit | Attending: Pain Medicine | Admitting: Pain Medicine

## 2013-07-11 ENCOUNTER — Other Ambulatory Visit: Payer: Self-pay | Admitting: Pain Medicine

## 2013-07-11 DIAGNOSIS — M549 Dorsalgia, unspecified: Secondary | ICD-10-CM

## 2018-05-19 ENCOUNTER — Other Ambulatory Visit: Payer: Self-pay | Admitting: Pain Medicine

## 2018-05-19 DIAGNOSIS — M533 Sacrococcygeal disorders, not elsewhere classified: Secondary | ICD-10-CM

## 2018-07-18 ENCOUNTER — Ambulatory Visit
Admission: RE | Admit: 2018-07-18 | Discharge: 2018-07-18 | Disposition: A | Discharge status: Home / Self Care | Payer: Medicare Other | Source: Ambulatory Visit | Attending: Pain Medicine | Admitting: Pain Medicine

## 2018-07-18 ENCOUNTER — Other Ambulatory Visit: Payer: Self-pay

## 2018-07-18 DIAGNOSIS — M533 Sacrococcygeal disorders, not elsewhere classified: Secondary | ICD-10-CM

## 2018-08-16 ENCOUNTER — Other Ambulatory Visit: Payer: Self-pay

## 2018-08-16 ENCOUNTER — Other Ambulatory Visit: Payer: Self-pay | Admitting: Physician Assistant

## 2018-08-16 ENCOUNTER — Ambulatory Visit
Admission: RE | Admit: 2018-08-16 | Discharge: 2018-08-16 | Disposition: A | Discharge status: Home / Self Care | Payer: Medicare Other | Source: Ambulatory Visit | Attending: Physician Assistant | Admitting: Physician Assistant

## 2018-08-16 DIAGNOSIS — M25512 Pain in left shoulder: Secondary | ICD-10-CM

## 2018-09-22 ENCOUNTER — Other Ambulatory Visit: Payer: Self-pay | Admitting: Pain Medicine

## 2018-09-22 DIAGNOSIS — M25512 Pain in left shoulder: Secondary | ICD-10-CM

## 2018-10-20 ENCOUNTER — Other Ambulatory Visit: Payer: Self-pay

## 2018-10-20 ENCOUNTER — Ambulatory Visit
Admission: RE | Admit: 2018-10-20 | Discharge: 2018-10-20 | Disposition: A | Discharge status: Home / Self Care | Payer: Medicare Other | Source: Ambulatory Visit | Attending: Pain Medicine | Admitting: Pain Medicine

## 2018-10-20 DIAGNOSIS — M25512 Pain in left shoulder: Secondary | ICD-10-CM

## 2019-05-22 ENCOUNTER — Other Ambulatory Visit: Payer: Self-pay | Admitting: Pain Medicine

## 2019-05-22 ENCOUNTER — Ambulatory Visit
Admission: RE | Admit: 2019-05-22 | Discharge: 2019-05-22 | Disposition: A | Discharge status: Home / Self Care | Payer: Medicare Other | Source: Ambulatory Visit | Attending: Pain Medicine | Admitting: Pain Medicine

## 2019-05-22 ENCOUNTER — Other Ambulatory Visit: Payer: Self-pay

## 2019-05-22 DIAGNOSIS — M25511 Pain in right shoulder: Secondary | ICD-10-CM

## 2019-06-22 ENCOUNTER — Other Ambulatory Visit: Payer: Self-pay | Admitting: Pain Medicine

## 2019-06-22 DIAGNOSIS — M25511 Pain in right shoulder: Secondary | ICD-10-CM

## 2019-07-11 ENCOUNTER — Other Ambulatory Visit: Payer: Medicare Other

## 2019-09-13 ENCOUNTER — Encounter: Payer: Self-pay | Admitting: *Deleted

## 2019-09-13 ENCOUNTER — Other Ambulatory Visit: Payer: Self-pay | Admitting: *Deleted

## 2019-09-15 ENCOUNTER — Encounter: Payer: Self-pay | Admitting: Diagnostic Neuroimaging

## 2019-09-15 ENCOUNTER — Other Ambulatory Visit: Payer: Self-pay

## 2019-09-15 ENCOUNTER — Ambulatory Visit (INDEPENDENT_AMBULATORY_CARE_PROVIDER_SITE_OTHER): Payer: Medicare Other | Admitting: Diagnostic Neuroimaging

## 2019-09-15 VITALS — BP 137/86 | HR 105 | Ht 65.0 in | Wt 172.2 lb

## 2019-09-15 DIAGNOSIS — R519 Headache, unspecified: Secondary | ICD-10-CM | POA: Diagnosis not present

## 2019-09-15 DIAGNOSIS — G43109 Migraine with aura, not intractable, without status migrainosus: Secondary | ICD-10-CM

## 2019-09-15 DIAGNOSIS — R0683 Snoring: Secondary | ICD-10-CM | POA: Diagnosis not present

## 2019-09-15 DIAGNOSIS — R5383 Other fatigue: Secondary | ICD-10-CM

## 2019-09-15 MED ORDER — RIZATRIPTAN BENZOATE 10 MG PO TBDP: 10.0000 mg | ORAL_TABLET | ORAL | 11 refills | Status: DC | PRN

## 2019-09-15 MED ORDER — AIMOVIG 70 MG/ML ~~LOC~~ SOAJ: 70.0000 mg | SUBCUTANEOUS | 4 refills | Status: DC

## 2019-09-15 NOTE — Progress Notes (Signed)
 [facility]  PATIENT: Tracy Fox DOB: 09/30/1960  REFERRING CLINICIAN: Miller, Lisa, MD HISTORY FROM: patient  REASON FOR VISIT: new consult    HISTORICAL  CHIEF COMPLAINT:  Chief Complaint  Patient presents with  . New Patient (Initial Visit)    Paper Referral by Dr. Spivey, PM for Headaches. Wakening in am with headaches since 2 yrs, has hx of them 20 yrs ago.    HISTORY OF PRESENT ILLNESS:   58-year-old female here for evaluation of headaches.  Patient has history of headaches as a child but these were not officially diagnosed.  Later in life she went to headache specialist was diagnosed with migraine treated with topiramate and Imitrex.  Headaches fluctuated over time.  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  Since she retired, headaches had slightly improved.  In the past 1 year headaches have worsened.  She describes global severe headaches with photophobia, scotoma, some nausea, lasting days at a time.  She averages 6 to 8 days of headache per month.  No specific triggering factors.  Patient also has chronic pain issues related to low back pain, history of spine surgery.  Patient also has snoring, daytime fatigue.  She had a sleep study many years ago which was unremarkable.  Symptoms are worse lately.  Also having wake up headaches.   REVIEW OF SYSTEMS: Full 14 system review of systems performed and negative with exception of: Memory loss headache dizziness pain incontinence spinning sensation fatigue.  ALLERGIES: Allergies  Allergen Reactions  . Other     darvocet  . Prednisone     HOME MEDICATIONS: Outpatient Medications Prior to Visit  Medication Sig Dispense Refill  . CALCIUM-VITAMIN D PO Take by mouth. Takes 650mg po 2 a day (VIACTIVE)    . Cetirizine HCl (ZYRTEC PO) Take 10 mg by mouth daily as needed.    . cyclobenzaprine (FLEXERIL) 10 MG tablet Take 10  mg by mouth daily as needed for muscle spasms.    . docusate sodium (COLACE) 100 MG capsule Take 100 mg by mouth in the morning, at noon, in the evening, and at bedtime.    . HYDROcodone-acetaminophen (NORCO) 10-325 MG tablet Take 1 tablet by mouth 2 (two) times daily as needed.    . morphine (MS CONTIN) 30 MG 12 hr tablet SMARTSIG:1 Tablet(s) By Mouth Every 12 Hours    . pregabalin (LYRICA) 100 MG capsule Take 100 mg by mouth 2 (two) times daily.     No facility-administered medications prior to visit.    PAST MEDICAL HISTORY: Past Medical History:  Diagnosis Date  . Bursitis    L shoulder  . Chronic pain syndrome   . Chronic shoulder pain    bilateral  . Failed back syndrome    lumbar  . Lumbar degenerative disc disease   . Lumbosacral spondylosis   . Migraine headache   . Rotator cuff syndrome of left shoulder   . Seasonal allergies     PAST SURGICAL HISTORY: Past Surgical History:  Procedure Laterality Date  . ABDOMINAL HYSTERECTOMY    . APPENDECTOMY    . GANGLION CYST EXCISION  2003  . LUMBAR LAMINECTOMY  2010  . NASAL SEPTUM SURGERY    . REFRACTIVE SURGERY    . TUMOR REMOVAL     from arm  . WISDOM TOOTH EXTRACTION      FAMILY HISTORY: History reviewed. No pertinent family   history.  SOCIAL HISTORY: Social History   Socioeconomic History  . Marital status: Single    Spouse name: Not on file  . Number of children: Not on file  . Years of education: Not on file  . Highest education level: Not on file  Occupational History  . Not on file  Tobacco Use  . Smoking status: Never Smoker  . Smokeless tobacco: Never Used  Substance and Sexual Activity  . Alcohol use: Yes    Comment: very rare  . Drug use: Never  . Sexual activity: Not on file  Other Topics Concern  . Not on file  Social History Narrative   Lives alone at home several cats.  DISABLED.  Education AAS degrees (2).  MT[employer] DEW once daily.   Social Determinants of Health   Financial Resource  Strain:   . Difficulty of Paying Living Expenses:   Food Insecurity:   . Worried About Running Out of Food in the Last Year:   . Ran Out of Food in the Last Year:   Transportation Needs:   . Lack of Transportation (Medical):   . Lack of Transportation (Non-Medical):   Physical Activity:   . Days of Exercise per Week:   . Minutes of Exercise per Session:   Stress:   . Feeling of Stress :   Social Connections:   . Frequency of Communication with Friends and Family:   . Frequency of Social Gatherings with Friends and Family:   . Attends Religious Services:   . Active Member of Clubs or Organizations:   . Attends Club or Organization Meetings:   . Marital Status:   Intimate Partner Violence:   . Fear of Current or Ex-Partner:   . Emotionally Abused:   . Physically Abused:   . Sexually Abused:      PHYSICAL EXAM  GENERAL EXAM/CONSTITUTIONAL: Vitals:  Vitals:   09/15/19 1051  BP: 137/86  Pulse: (!) 105  Weight: 172 lb 3.2 oz (78.1 kg)  Height: 5' 5" (1.651 m)    Body mass index is 28.66 kg/m. Wt Readings from Last 3 Encounters:  09/15/19 172 lb 3.2 oz (78.1 kg)     Patient is in no distress; well developed, nourished and groomed; neck is supple  CARDIOVASCULAR:  Examination of carotid arteries is normal; no carotid bruits  Regular rate and rhythm, no murmurs  Examination of peripheral vascular system by observation and palpation is normal  EYES:  Ophthalmoscopic exam of optic discs and posterior segments is normal; no papilledema or hemorrhages  No exam data present  MUSCULOSKELETAL:  Gait, strength, tone, movements noted in Neurologic exam below  NEUROLOGIC: MENTAL STATUS:  No flowsheet data found.  awake, alert, oriented to person, place and time  recent and remote memory intact  normal attention and concentration  language fluent, comprehension intact, naming intact  fund of knowledge appropriate  CRANIAL NERVE:   2nd - no papilledema on  fundoscopic exam  2nd, 3rd, 4th, 6th - pupils equal and reactive to light, visual fields full to confrontation, extraocular muscles intact, no nystagmus  5th - facial sensation symmetric  7th - facial strength symmetric  8th - hearing intact  9th - palate elevates symmetrically, uvula midline  11th - shoulder shrug symmetric  12th - tongue protrusion midline  MOTOR:   normal bulk and tone, full strength in the BUE, BLE  SENSORY:   normal and symmetric to light touch, temperature, vibration  COORDINATION:   finger-nose-finger, fine finger movements normal    REFLEXES:   deep tendon reflexes TRACE and symmetric  GAIT/STATION:   narrow based gait     DIAGNOSTIC DATA (LABS, IMAGING, TESTING) - I reviewed patient records, labs, notes, testing and imaging myself where available.  Lab Results  Component Value Date   WBC 9.2 04/24/2008   HGB 11.7 (L) 04/24/2008   HCT 32.9 (L) 04/24/2008   MCV 89.6 04/24/2008   PLT 244 04/24/2008      Component Value Date/Time   NA 139 04/24/2008 0604   K 4.0 04/24/2008 0604   CL 99 04/24/2008 0604   CO2 32 04/24/2008 0604   GLUCOSE 120 (H) 04/24/2008 0604   BUN 10 04/24/2008 0604   CREATININE 0.84 04/24/2008 0604   CALCIUM 9.2 04/24/2008 0604   GFRNONAA >60 04/24/2008 0604   GFRAA  04/24/2008 0604    >60        The eGFR has been calculated using the MDRD equation. This calculation has not been validated in all clinical situations. eGFR's persistently <60 mL/min signify possible Chronic Kidney Disease.   No results found for: CHOL, HDL, LDLCALC, LDLDIRECT, TRIG, CHOLHDL No results found for: HGBA1C No results found for: VITAMINB12 No results found for: TSH     ASSESSMENT AND PLAN  59 y.o. year old female here with worsening headaches in the past 1 year, with some migraine features.  Could represent worsening of migraine versus other secondary cause.  Will check MRI of the brain to rule out secondary cause of  headache.  Will check sleep study to rule out sleep apnea.  We will also start migraine treatments.  Dx:  1. New onset of headaches after age 65   2. Migraine with aura and without status migrainosus, not intractable   3. Other fatigue   4. Snoring      PLAN:  WORSENING HEADACHES - check MRI brain   MIGRAINE WITH AURA  MIGRAINE PREVENTION  LIFESTYLE CHANGES -Stop or avoid smoking -Decrease or avoid caffeine / alcohol -Eat and sleep on a regular schedule -Exercise several times per week - cannot take topiramate (memory loss) - cannot take propranolol (fatigue) - cannot take amitriptyline (polypharmacy; sedation) - start aimovig 67m monthly injections  MIGRAINE RESCUE  - ibuprofen, tylenol as needed - start rizatriptan (Maxalt) 138mas needed for breakthrough headache; may repeat x 1 after 2 hours; max 2 tabs per day or 8 per month - consider rimegepant (Nurtec) 7551ms needed for breakthrough headache; max 8 per month   SNORING / FATIGUE / WAKE UP HEADACHE - check sleep study  Orders Placed This Encounter  Procedures  . MR BRAIN W WO CONTRAST  . Ambulatory referral to Sleep Studies    Meds ordered this encounter  Medications  . Erenumab-aooe (AIMOVIG) 70 MG/ML SOAJ    Sig: Inject 70 mg into the skin every 30 (thirty) days.    Dispense:  3 mL    Refill:  4  . rizatriptan (MAXALT-MLT) 10 MG disintegrating tablet    Sig: Take 1 tablet (10 mg total) by mouth as needed for migraine. May repeat in 2 hours if needed    Dispense:  9 tablet    Refill:  11   Return in about 6 months (around 03/17/2020) for with NP (Ajooni Lomax).    VIKPenni BombardD [id number]/65/6/[id number]:86:[id number] Certified in Neurology, Neurophysiology and Neuroimaging  [facility] [address]fieldeNew Middlet[place] [id number]

## 2019-09-15 NOTE — Patient Instructions (Signed)
  WORSENING HEADACHES - check MRI brain  MIGRAINE TREATMENT PLAN:  MIGRAINE PREVENTION  LIFESTYLE CHANGES -Stop or avoid smoking -Decrease or avoid caffeine / alcohol -Eat and sleep on a regular schedule -Exercise several times per week - start aimovig 70mg  monthly injections  MIGRAINE RESCUE  - ibuprofen, tylenol as needed - start rizatriptan (Maxalt) 10mg  as needed for breakthrough headache; may repeat x 1 after 2 hours; max 2 tabs per day or 8 per month   SNORING / FATIGUE / WAKE UP HEADACHE - check sleep study

## 2019-09-18 ENCOUNTER — Telehealth: Payer: Self-pay | Admitting: Diagnostic Neuroimaging

## 2019-09-18 NOTE — Telephone Encounter (Signed)
medicare order sent to GI. No auth they will reach out to the patient to schedule.  

## 2019-09-19 ENCOUNTER — Telehealth: Payer: Self-pay | Admitting: *Deleted

## 2019-09-19 MED ORDER — NORTRIPTYLINE HCL 10 MG PO CAPS: 20.0000 mg | ORAL_CAPSULE | Freq: Every day | ORAL | 11 refills | Status: DC

## 2019-09-19 NOTE — Telephone Encounter (Signed)
Aimovig PA , key: [id number]. Failed topiramate, Imitrex.  Your information has been submitted to [employer]. [employer] will review the request and will issue a decision, typically within 1-3 days from your submission.  If [employer] has not responded in 1-3 days or if you have any questions about your ePA request, please contact [employer] at [phone].

## 2019-09-19 NOTE — Addendum Note (Signed)
Addended by: Levert Feinstein on: 09/19/2019 05:33 PM   Modules accepted: Orders

## 2019-09-19 NOTE — Telephone Encounter (Signed)
Please let patient know above information,  Choices are beta-blocker Inderal 40 mg twice a day Antidepression, nortriptyline, 10 mg 2 tablets every night, Effexor XR 37.5 mg daily as migraine prevention

## 2019-09-19 NOTE — Telephone Encounter (Signed)
Per CMM, Aimovig denied. We denied this request under Medicare Part D because: The information provided by your prescriber did not meet the requirements for covering this medication (prior authorization). Your plan does not allow coverage of this medication based on your prescriber answering NO to the following  question(s): Has the patient experienced an inadequate treatment response with a 4-week trial of any of the following: Antiepileptic drugs (AEDs), Beta-adrenergic blocking agents, Antidepressants? Has the patient experienced an intolerance or does the patient have a contraindication that would prohibit a 4-week trial of any of the following: Antiepileptic drugs (AEDs), Beta-adrenergic blocking agents, Antidepressants?

## 2019-09-19 NOTE — Telephone Encounter (Signed)
Meds ordered this encounter  Medications  . nortriptyline (PAMELOR) 10 MG capsule    Sig: Take 2 capsules (20 mg total) by mouth at bedtime.    Dispense:  60 capsule    Refill:  11    

## 2019-09-19 NOTE — Telephone Encounter (Signed)
Spoke with patient and gave her Dr Tracy Fox options. She stated that she doesn't want to research these. She stated that if she tries it may make her anxious trying to decide. She is asking MD to prescribe whichever she feels may be best to try. I advised will let Dr Terrace Arabia know. Patient verbalized understanding, appreciation.

## 2019-09-20 NOTE — Telephone Encounter (Signed)
Called patient and LVM  informing her Dr Terrace Arabia sent in Rx for nortriptyline. Left # for questions.

## 2019-09-28 ENCOUNTER — Other Ambulatory Visit: Payer: Self-pay

## 2019-09-28 ENCOUNTER — Ambulatory Visit
Admission: RE | Admit: 2019-09-28 | Discharge: 2019-09-28 | Disposition: A | Discharge status: Home / Self Care | Payer: Medicare Other | Source: Ambulatory Visit | Attending: Pain Medicine | Admitting: Pain Medicine

## 2019-09-28 DIAGNOSIS — M25511 Pain in right shoulder: Secondary | ICD-10-CM

## 2019-09-28 DIAGNOSIS — G8929 Other chronic pain: Secondary | ICD-10-CM

## 2019-10-02 ENCOUNTER — Other Ambulatory Visit: Payer: Self-pay

## 2019-10-02 ENCOUNTER — Ambulatory Visit
Admission: RE | Admit: 2019-10-02 | Discharge: 2019-10-02 | Disposition: A | Discharge status: Home / Self Care | Payer: Medicare Other | Source: Ambulatory Visit | Attending: Diagnostic Neuroimaging | Admitting: Diagnostic Neuroimaging

## 2019-10-02 DIAGNOSIS — R519 Headache, unspecified: Secondary | ICD-10-CM

## 2019-10-02 MED ORDER — GADOBENATE DIMEGLUMINE 529 MG/ML IV SOLN
16.0000 mL | Freq: Once | INTRAVENOUS | Status: AC | PRN
  Administered 2019-10-02: 16 mL via INTRAVENOUS

## 2019-10-04 ENCOUNTER — Telehealth: Payer: Self-pay | Admitting: *Deleted

## 2019-10-04 NOTE — Telephone Encounter (Signed)
LVM informing patient her MRI brain is a normal, good news. She has sleep consult scheduled. Left # for questions.

## 2019-10-24 ENCOUNTER — Institutional Professional Consult (permissible substitution): Payer: Medicare Other | Admitting: Neurology

## 2019-11-13 ENCOUNTER — Ambulatory Visit (INDEPENDENT_AMBULATORY_CARE_PROVIDER_SITE_OTHER): Payer: Medicare Other | Admitting: Neurology

## 2019-11-13 ENCOUNTER — Encounter: Payer: Self-pay | Admitting: Neurology

## 2019-11-13 ENCOUNTER — Other Ambulatory Visit: Payer: Self-pay

## 2019-11-13 VITALS — BP 156/96 | HR 103 | Ht 65.0 in | Wt 164.5 lb

## 2019-11-13 DIAGNOSIS — R351 Nocturia: Secondary | ICD-10-CM

## 2019-11-13 DIAGNOSIS — R0683 Snoring: Secondary | ICD-10-CM | POA: Diagnosis not present

## 2019-11-13 DIAGNOSIS — R519 Headache, unspecified: Secondary | ICD-10-CM | POA: Diagnosis not present

## 2019-11-13 DIAGNOSIS — G4700 Insomnia, unspecified: Secondary | ICD-10-CM

## 2019-11-13 DIAGNOSIS — Z820 Family history of epilepsy and other diseases of the nervous system: Secondary | ICD-10-CM

## 2019-11-13 DIAGNOSIS — E663 Overweight: Secondary | ICD-10-CM

## 2019-11-13 DIAGNOSIS — G4763 Sleep related bruxism: Secondary | ICD-10-CM

## 2019-11-13 DIAGNOSIS — Z79891 Long term (current) use of opiate analgesic: Secondary | ICD-10-CM

## 2019-11-13 NOTE — Patient Instructions (Signed)

## 2019-11-13 NOTE — Progress Notes (Signed)
Subjective:    Patient ID: BRIANE BIRDEN is a 59 y.o. female.  HPI     Huston Foley, MD, PhD [facility] [address], [address] [address] [place], [place] [address]  Dear [name],   I saw your patient, Azjah Pardo, upon your kind request, in my Sleep clinic today for initial consultation of her sleep disorder, in particular, concern for underlying obstructive sleep apnea.  The patient is unaccompanied today.  As you know, Ms. Guilford is a 59 year old right-handed woman with an underlying medical history of allergies, migraine headaches, lumbar degenerative disc disease, on chronic pain medication, and mildly overweight state, who reports snoring and chronic difficulty with her sleep, including difficulty maintaining sleep.  She had a sleep study some 15 years ago which showed snoring but no significant sleep apnea.  She is followed by pain management for chronic back pain.  She is on several medications including Norco, MS Contin, Pamelor, Lyrica.  She also takes Flexeril.  For her migraines she is on Aimovig and Maxalt. Her Epworth sleepiness score is 6 out of 24, fatigue severity score is 49 out of 63.  She is single and lives alone.  She does not smoke, she drinks alcohol rarely, maybe up to twice a year.  She drinks caffeine in the form of soda, about 2 servings per day on average.  She reports that her pain management doctor also suggested she seek evaluation with a sleep study.  She does not wake up rested.  She is tired during the day.  Bedtime and rise time are quite variable but she may go to bed as late as 2 AM and rise time may be in the afternoon, may be as late as 2 PM.  She reports that her sleep cycle has changed.  She does not have nocturnal or recurrent morning headaches.  She reports significant nocturia about 3-4 times per average night.  She has a permanent retainer on the bottom.  She also has a bite guard which she does not use on a regular basis.  She has a history  of tooth grinding.  She does have difficulty going to sleep and staying asleep.  Her brother has sleep apnea on a CPAP machine.  Her Past Medical History Is Significant For: Past Medical History:  Diagnosis Date  . Bursitis    L shoulder  . Chronic pain syndrome   . Chronic shoulder pain    bilateral  . Failed back syndrome    lumbar  . Lumbar degenerative disc disease   . Lumbosacral spondylosis   . Migraine headache   . Rotator cuff syndrome of left shoulder   . Seasonal allergies     Her Past Surgical History Is Significant For: Past Surgical History:  Procedure Laterality Date  . ABDOMINAL HYSTERECTOMY    . APPENDECTOMY    . GANGLION CYST EXCISION  2003  . LUMBAR LAMINECTOMY  2010  . NASAL SEPTUM SURGERY    . REFRACTIVE SURGERY    . TUMOR REMOVAL     from arm  . WISDOM TOOTH EXTRACTION      Her Family History Is Significant For: Family History  Problem Relation Age of Onset  . Ovarian cancer Mother   . Colon cancer Father     Her Social History Is Significant For: Social History   Socioeconomic History  . Marital status: Single    Spouse name: Not on file  . Number of children: 0  . Years of  education: college  . Highest education level: Associate degree: academic program  Occupational History  . Occupation: Disabled  Tobacco Use  . Smoking status: Never Smoker  . Smokeless tobacco: Never Used  Substance and Sexual Activity  . Alcohol use: Yes    Comment: very rare  . Drug use: Never  . Sexual activity: Not on file  Other Topics Concern  . Not on file  Social History Narrative   Lives alone at home several cats.  DISABLED.  Education AAS degrees (2).  MTN DEW once daily. Right-handed.   Social Determinants of Health   Financial Resource Strain:   . Difficulty of Paying Living Expenses: Not on file  Food Insecurity:   . Worried About Programme researcher, broadcasting/film/video in the Last Year: Not on file  . Ran Out of Food in the Last Year: Not on file   Transportation Needs:   . Lack of Transportation (Medical): Not on file  . Lack of Transportation (Non-Medical): Not on file  Physical Activity:   . Days of Exercise per Week: Not on file  . Minutes of Exercise per Session: Not on file  Stress:   . Feeling of Stress : Not on file  Social Connections:   . Frequency of Communication with Friends and Family: Not on file  . Frequency of Social Gatherings with Friends and Family: Not on file  . Attends Religious Services: Not on file  . Active Member of Clubs or Organizations: Not on file  . Attends Banker Meetings: Not on file  . Marital Status: Not on file    Her Allergies Are:  Allergies  Allergen Reactions  . Latex Other (See Comments)    Swelling with elastic bands in mouth during braces.  . Other     darvocet  . Prednisone   :   Her Current Medications Are:  Outpatient Encounter Medications as of 11/13/2019  Medication Sig  . CALCIUM-VITAMIN D PO Take by mouth. Takes 650mg  po 2 a day (VIACTIVE)  . Cetirizine HCl (ZYRTEC PO) Take 10 mg by mouth daily as needed.  . cyclobenzaprine (FLEXERIL) 10 MG tablet Take 10 mg by mouth daily as needed for muscle spasms.  docusate sodium (COLACE) 100 MG capsule Take 100 mg by mouth in the morning, at noon, in the evening, and at bedtime.  Marland Kitchen (AIMOVIG) 70 MG/ML SOAJ Inject 70 mg into the skin every 30 (thirty) days.  Dorise Hiss HYDROcodone-acetaminophen (NORCO) 10-325 MG tablet Take 1 tablet by mouth 2 (two) times daily as needed.  Marland Kitchen morphine (MS CONTIN) 30 MG 12 hr tablet SMARTSIG:1 Tablet(s) By Mouth Every 12 Hours  . nortriptyline (PAMELOR) 10 MG capsule Take 2 capsules (20 mg total) by mouth at bedtime.  . pregabalin (LYRICA) 100 MG capsule Take 100 mg by mouth 2 (two) times daily.  . rizatriptan (MAXALT-MLT) 10 MG disintegrating tablet Take 1 tablet (10 mg total) by mouth as needed for migraine. May repeat in 2 hours if needed   No facility-administered encounter  medications on file as of 11/13/2019.  :  Review of Systems:  Out of a complete 14 point review of systems, all are reviewed and negative with the exception of these symptoms as listed below: Review of Systems  Neurological:       Reports fatigue, snoring and frequent headaches. Prior sleep study 15+ years. States study just showed snoring. Never been on CPAP therapy.   Epworth Sleepiness Scale 0= would never doze 1= slight chance  of dozing 2= moderate chance of dozing 3= high chance of dozing  Sitting and reading: 1 Watching TV: 2 Sitting inactive in a public place (ex. Theater or meeting): 1 As a passenger in a car for an hour without a break: 0 Lying down to rest in the afternoon: 1 Sitting and talking to someone: 0 Sitting quietly after lunch (no alcohol): 1 In a car, while stopped in traffic: 0 Total: 6    Objective:  Neurological Exam  Physical Exam Physical Examination:   Vitals:   11/13/19 1057  BP: (!) 156/96  Pulse: (!) 103    General Examination: The patient is a very pleasant 59 y.o. female in no acute distress. She appears well-developed and well-nourished and well groomed.   HEENT: Normocephalic, atraumatic, pupils are equal, round and reactive to light, extraocular tracking is good without limitation to gaze excursion or nystagmus noted. Hearing is grossly intact. Face is symmetric with normal facial animation. Speech is clear with no dysarthria noted. There is no hypophonia. There is no lip, neck/head, jaw or voice tremor. Neck is supple with full range of passive and active motion. There are no carotid bruits on auscultation. Oropharynx exam reveals: moderate mouth dryness, adequate dental hygiene, retainer in place on the bottom.  She has mild airway crowding, neck circumference of 13 and three-quarter inches.  Minimal overbite.  Slightly longer and thinner uvula.  Tongue protrudes centrally and palate elevates symmetrically.   Chest: Clear to auscultation  without wheezing, rhonchi or crackles noted.  Heart: S1+S2+0, regular and normal without murmurs, rubs or gallops noted.   Abdomen: Soft, non-tender and non-distended with normal bowel sounds appreciated on auscultation.  Extremities: There is no pitting edema in the distal lower extremities bilaterally.   Skin: Warm and dry without trophic changes noted.   Musculoskeletal: exam reveals no obvious joint deformities, tenderness or joint swelling or erythema.   Neurologically:  Mental status: The patient is awake, alert and oriented in all 4 spheres. Her immediate and remote memory, attention, language skills and fund of knowledge are appropriate. There is no evidence of aphasia, agnosia, apraxia or anomia. Speech is clear with normal prosody and enunciation. Thought process is linear. Mood is normal and affect is normal.  Cranial nerves II - XII are as described above under HEENT exam.  Motor exam: Normal bulk, strength and tone is noted. There is no tremor, Fine motor skills and coordination: grossly intact.  Cerebellar testing: No dysmetria or intention tremor. There is no truncal or gait ataxia.  Sensory exam: intact to light touch in the upper and lower extremities.  Gait, station and balance: She stands without significant difficulty.  She walks without a walking aid.  She does report some low back stiffness with standing.  No limp noted.   Assessment and Plan:  In summary, Candy Charlyn MinervaM Hoare is a very pleasant 59 y.o.-year old female  with an underlying medical history of allergies, migraine headaches, lumbar degenerative disc disease, on chronic pain medication, and mildly overweight state, who presents for evaluation of her sleep disturbance.  She has a history of difficulty initiating and maintaining sleep.  She has a history of snoring and would be willing to get evaluated for underlying obstructive sleep disordered breathing. I had a long chat with the patient about my findings and the  diagnosis of OSA, its prognosis and treatment options. We talked about medical treatments, surgical interventions and non-pharmacological approaches. I explained in particular the risks and ramifications of untreated moderate  to severe OSA, especially with respect to developing cardiovascular disease down the Road, including congestive heart failure, difficult to treat hypertension, cardiac arrhythmias, or stroke. Even type 2 diabetes has, in part, been linked to untreated OSA. Symptoms of untreated OSA include daytime sleepiness, memory problems, mood irritability and mood disorder such as depression and anxiety, lack of energy, as well as recurrent headaches, especially morning headaches. We talked about trying to maintain a healthy lifestyle in general, as well as the importance of weight control. We also talked about the importance of good sleep hygiene. I recommended the following at this time: sleep study.   I explained the sleep test procedure to the patient and also outlined possible surgical and non-surgical treatment options of OSA, including the use of a custom-made dental device (which would require a referral to a specialist dentist or oral surgeon), upper airway surgical options, such as traditional UPPP or a novel less invasive surgical option in the form of Inspire hypoglossal nerve stimulation (which would involve a referral to an ENT surgeon). I also explained the CPAP treatment option to the patient, who indicated that she would be willing to try CPAP if the need arises.  I plan to see her back after testing.  I answered all her questions today and she was in agreement.   Thank you very much for allowing me to participate in the care of this nice patient. If I can be of any further assistance to you please do not hesitate to talk to me.  Sincerely,   Huston Foley, MD, PhD

## 2019-11-29 ENCOUNTER — Other Ambulatory Visit: Payer: Self-pay

## 2019-11-29 ENCOUNTER — Ambulatory Visit (INDEPENDENT_AMBULATORY_CARE_PROVIDER_SITE_OTHER): Payer: Medicare Other | Admitting: Neurology

## 2019-11-29 DIAGNOSIS — G4733 Obstructive sleep apnea (adult) (pediatric): Secondary | ICD-10-CM

## 2019-11-29 DIAGNOSIS — E663 Overweight: Secondary | ICD-10-CM

## 2019-11-29 DIAGNOSIS — G4720 Circadian rhythm sleep disorder, unspecified type: Secondary | ICD-10-CM

## 2019-11-29 DIAGNOSIS — G4734 Idiopathic sleep related nonobstructive alveolar hypoventilation: Secondary | ICD-10-CM

## 2019-11-29 DIAGNOSIS — R519 Headache, unspecified: Secondary | ICD-10-CM

## 2019-11-29 DIAGNOSIS — R0683 Snoring: Secondary | ICD-10-CM

## 2019-11-29 DIAGNOSIS — Z79891 Long term (current) use of opiate analgesic: Secondary | ICD-10-CM

## 2019-11-29 DIAGNOSIS — G4700 Insomnia, unspecified: Secondary | ICD-10-CM

## 2019-12-14 ENCOUNTER — Telehealth: Payer: Self-pay | Admitting: Neurology

## 2019-12-14 NOTE — Telephone Encounter (Signed)
I called Tracy Fox. I advised Tracy Fox that Dr. Frances Furbish reviewed their sleep study results and found that she has severe sleep apnea and hypoxia and recommends that Tracy Fox be treated with a cpap. Dr. Frances Furbish recommends that Tracy Fox return for a repeat sleep study in order to properly titrate the cpap and ensure a good mask fit. Tracy Fox is agreeable to returning for a titration study. I advised Tracy Fox that our sleep lab will file with Tracy Fox's insurance and call Tracy Fox to schedule the sleep study when we hear back from the Tracy Fox's insurance regarding coverage of this sleep study. Tracy Fox verbalized understanding of results. Tracy Fox had no questions at this time but was encouraged to call back if questions arise.

## 2019-12-14 NOTE — Addendum Note (Signed)
Addended by: Huston Foley on: 12/14/2019 09:24 AM   Modules accepted: Orders

## 2019-12-14 NOTE — Progress Notes (Signed)
Patient referred by Dr. Marjory Lies, seen by me on 11/13/19, diagnostic PSG on 11/29/19.   Please call and notify the patient that the recent sleep study showed severe obstructive sleep apnea and lower O2 saturations; she was placed on suppl O2 at 2 lpm. I recommend treatment with CPAP. Sedating meds especially narcotics should be used with extreme caution and avoided closed to bedime. I will request a repeat sleep study for proper CPAP titration and mask fitting and correct monitoring of the oxygen saturations. Please explain to patient. I have placed an order in the chart. Thanks.  Huston Foley, MD, PhD [facility])

## 2019-12-14 NOTE — Telephone Encounter (Signed)
Called patient to discuss sleep study results. No answer at this time. LVM for the patient to call back.   

## 2019-12-14 NOTE — Procedures (Signed)
PATIENT'S NAME:  Tracy Fox, Tracy Fox DOB:      [birth year]      MR#:    [id number]     DATE OF RECORDING: 11/29/2019 REFERRING M.D.:  Joycelyn Schmid, MD Study Performed:   Baseline Polysomnogram HISTORY:  59 year old woman with a history of allergies, migraine headaches, lumbar degenerative disc disease, on chronic pain medication, and mildly overweight state, who reports snoring and chronic difficulty with her sleep, including difficulty maintaining sleep and tiredness. The patient's weight 164 pounds with a height of 65 (inches), resulting in a BMI of 27.2 kg/m2. The patient's neck circumference measured 13 inches.  CURRENT MEDICATIONS: Calcium-Vit D, Zyrtec, Flexeril, Colace, Aimovig, Norco, MS Contin, Pamelor, Lyrica, Maxalt   PROCEDURE:  This is a multichannel digital polysomnogram utilizing the Somnostar 11.2 system.  Electrodes and sensors were applied and monitored per AASM Specifications.   EEG, EOG, Chin and Limb EMG, were sampled at 200 Hz.  ECG, Snore and Nasal Pressure, Thermal Airflow, Respiratory Effort, CPAP Flow and Pressure, Oximetry was sampled at 50 Hz. Digital video and audio were recorded.      BASELINE STUDY  Lights Out was at 21:55 and Lights On at 04:56.  Total recording time (TRT) was 421.5 minutes, with a total sleep time (TST) of 301.5 minutes.   The patient's sleep latency was 81 minutes, which is delayed. REM latency was 368.5 minutes, which is markedly delayed. The sleep efficiency was 71.5%, which is reduced.     SLEEP ARCHITECTURE: WASO (Wake after sleep onset) was 71.5 minutes with mild to moderate sleep fragmentation noted. There were 14 minutes in Stage N1, 209 minutes Stage N2, 74 minutes Stage N3 and 4.5 minutes in Stage REM.  The percentage of Stage N1 was 4.6%, Stage N2 was 69.3%, which is increased, Stage N3 was 24.5% and Stage R (REM sleep) was nearly absent at 1.5%. The arousals were noted as: 22 were spontaneous, 0 were associated with PLMs, 212 were associated  with respiratory events.  RESPIRATORY ANALYSIS:  There were a total of 240 respiratory events:  183 obstructive apneas, 0 central apneas and 2 mixed apneas with a total of 185 apneas and an apnea index (AI) of 36.8 /hour. There were 55 hypopneas with a hypopnea index of 10.9 /hour. The patient also had 0 respiratory event related arousals (RERAs).      The total APNEA/HYPOPNEA INDEX (AHI) was 47.8/hour and the total RESPIRATORY DISTURBANCE INDEX was  47.8 /hour.  4 events occurred in REM sleep and 104 events in NREM. The REM AHI was  53.3 /hour, versus a non-REM AHI of 47.7. The patient spent 268 minutes of total sleep time in the supine position and 34 minutes in non-supine.. The supine AHI was 52.6 versus a non-supine AHI of 9.0.  OXYGEN SATURATION & C02:  The baseline 02 saturation was 88%, with the lowest being 67% for the night. Supplemental oxygen was added due to lower oxygen saturations noted at baseline, even during wakefulness with an average of 88% saturation.  Oxygen was started at 1 L/min at epoch 48, 10:15 PM and increased to 2 L/min at 1:54 AM, epoch 486. Time spent below 89% saturation equaled 125 minutes for the night.  PERIODIC LIMB MOVEMENTS: The patient had a total of 0 Periodic Limb Movements.  The Periodic Limb Movement (PLM) index was 0 and the PLM Arousal index was 0/hour.  Audio and video analysis did not show any abnormal or unusual movements, behaviors, phonations or vocalizations. The patient took bathroom breaks.  Snoring was noted, ranging from mild to loud. The EKG was in keeping with normal sinus rhythm (NSR). Post-study, the patient indicated that sleep was worse than usual.   IMPRESSION: 1. Severe Obstructive Sleep Apnea (OSA) 2. Nocturnal hypoxemia 3. Dysfunctions associated with sleep stages or arousal from sleep  RECOMMENDATIONS: 1. This study demonstrates severe obstructive sleep apnea and nocturnal hypoxemia, with lower average oxygen saturations even during  wakefulness, with a total AHI of 47.8/hour, REM AHI of 53.3/hour, supine AHI of 52.6/hour and O2 nadir of 67%.  The patient was placed on supplemental oxygen during wakefulness at 1 L/min and during sleep at 2 L/min.  Sedating medication including narcotic pain medication should be used with extreme caution; narcotic pain medication should be avoided close to bedtime. Treatment with positive airway pressure in the form of CPAP is recommended. This will require a full night titration study to optimize therapy. Other treatment options may include avoidance of supine sleep position along with weight loss, upper airway or jaw surgery in selected patients or the use of an oral appliance in certain patients. ENT evaluation and/or consultation with a maxillofacial surgeon or dentist may be feasible in some instances.  Please note that untreated obstructive sleep apnea may carry additional perioperative morbidity. Patients with significant obstructive sleep apnea should receive perioperative PAP therapy and the surgeons and particularly the anesthesiologist should be informed of the diagnosis and the severity of the sleep disordered breathing. 2. This study shows sleep fragmentation and abnormal sleep stage percentages; these are nonspecific findings and per se do not signify an intrinsic sleep disorder or a cause for the patient's sleep-related symptoms. Causes include (but are not limited to) the first night effect of the sleep study, circadian rhythm disturbances, medication effect or an underlying mood disorder or medical problem.  3. The patient should be cautioned not to drive, work at heights, or operate dangerous or heavy equipment when tired or sleepy. Review and reiteration of good sleep hygiene measures should be pursued with any patient. 4. The patient will be seen in follow-up in the sleep clinic at [facility] for discussion of the test results, symptom and treatment compliance review, further management  strategies, etc. The referring provider will be notified of the test results. I certify that I have reviewed the entire raw data recording prior to the issuance of this report in accordance with the Standards of Accreditation of the American Academy of Sleep Medicine (AASM)  Huston Foley, MD, PhD Diplomat, American Board of Neurology and Sleep Medicine (Neurology and Sleep Medicine)

## 2019-12-14 NOTE — Telephone Encounter (Signed)
-----   Message from Huston Foley, MD sent at 12/14/2019  9:24 AM EDT ----- Patient referred by Dr. Marjory Lies, seen by me on 11/13/19, diagnostic PSG on 11/29/19.   Please call and notify the patient that the recent sleep study showed severe obstructive sleep apnea and lower O2 saturations; she was placed on suppl O2 at 2 lpm. I recommend treatment with CPAP. Sedating meds especially narcotics should be used with extreme caution and avoided closed to bedime. I will request a repeat sleep study for proper CPAP titration and mask fitting and correct monitoring of the oxygen saturations. Please explain to patient. I have placed an order in the chart. Thanks.  Huston Foley, MD, PhD [facility])

## 2020-01-02 ENCOUNTER — Ambulatory Visit (INDEPENDENT_AMBULATORY_CARE_PROVIDER_SITE_OTHER): Payer: Medicare Other | Admitting: Neurology

## 2020-01-02 DIAGNOSIS — G4731 Primary central sleep apnea: Secondary | ICD-10-CM

## 2020-01-02 DIAGNOSIS — E663 Overweight: Secondary | ICD-10-CM

## 2020-01-02 DIAGNOSIS — G4733 Obstructive sleep apnea (adult) (pediatric): Secondary | ICD-10-CM

## 2020-01-02 DIAGNOSIS — Z9989 Dependence on other enabling machines and devices: Secondary | ICD-10-CM

## 2020-01-02 DIAGNOSIS — G4739 Other sleep apnea: Secondary | ICD-10-CM

## 2020-01-02 DIAGNOSIS — G4720 Circadian rhythm sleep disorder, unspecified type: Secondary | ICD-10-CM

## 2020-01-02 DIAGNOSIS — R519 Headache, unspecified: Secondary | ICD-10-CM

## 2020-01-02 DIAGNOSIS — G4700 Insomnia, unspecified: Secondary | ICD-10-CM

## 2020-01-02 DIAGNOSIS — G4734 Idiopathic sleep related nonobstructive alveolar hypoventilation: Secondary | ICD-10-CM

## 2020-01-02 DIAGNOSIS — Z79891 Long term (current) use of opiate analgesic: Secondary | ICD-10-CM

## 2020-01-16 ENCOUNTER — Telehealth: Payer: Self-pay

## 2020-01-16 NOTE — Telephone Encounter (Signed)
-----   Message from Huston Foley, MD sent at 01/16/2020 10:21 AM EST ----- Patient referred by Dr. Marjory Lies, seen by me on 11/13/19, diagnostic PSG on 11/29/19. Patient had a CPAP titration study on 01/02/20.   Please call and inform patient that she had severe sleep apnea during the second study and CPAP alone was not effective. She was tried on another machine called BiPAP, which did not help enough and she had severe Central sleep apnea, which can be, in part, due to her medications she is on.  She eventually did well on a machine called ASV. I have entered an order for treatment with ASV at home. We will see the patient back in follow-up in about 10 weeks. Please also explain to the patient that I will be looking out for compliance data, which can be downloaded from the machine (stored on an SD card, that is inserted in the machine) or via remote access through a modem, that is built into the machine. At the time of the followup appointment we will discuss sleep study results and how it is going with PAP treatment at home.  I would, again, advise patient to work with her providers to reduce sedating and especially narcotic medication as much as possible.  Please also make sure, the patient has a follow-up appointment with me in about 10 weeks from the setup date, thanks. May see one of our nurse practitioners if needed for proper timing of the FU appointment.  Please fax or route the report to the referring provider. Thanks,   Huston Foley, MD, PhD [facility])

## 2020-01-16 NOTE — Telephone Encounter (Signed)
Pt returned phone call.  

## 2020-01-16 NOTE — Telephone Encounter (Signed)
I called pt. I advised pt that Dr. Frances Furbish reviewed their sleep study results and found that pt finally did well on an ASV to treat her severe central sleep apnea. Dr. Frances Furbish recommends that pt start an ASV at home and also speak to her other providers about reducing her potentially sedating medications. I reviewed PAP compliance expectations with the pt. Pt is agreeable to starting an ASV. I advised pt that an order will be sent to a DME, [facility], and [facility] will call the pt within about one week after they file with the pt's insurance. [facility] will show the pt how to use the machine, fit for masks, and troubleshoot the ASV if needed. A follow up appt was made for insurance purposes with Gurpreet, NP on 05/08/2020 at 3:00pm. Pt verbalized understanding to arrive 15 minutes early and bring their ASV. A letter with all of this information in it will be mailed to the pt as a reminder. I verified with the pt that the address we have on file is correct. Pt verbalized understanding of results. Pt had no questions at this time but was encouraged to call back if questions arise. I have sent the order to [facility] and have received confirmation that they have received the order.

## 2020-01-16 NOTE — Procedures (Signed)
PATIENT'S NAME:  Tracy Fox, Tracy Fox DOB:      Sep 23, 1960      MR#:    [id number]     DATE OF RECORDING: 01/02/2020 REFERRING M.D.:  Joycelyn Schmid, MD Study Performed:   CPAP  Titration HISTORY: 59 year old woman with a history of allergies, migraine headaches, lumbar degenerative disc disease, on chronic pain medication, and mildly overweight state, who presents for a full night titration study. Her baseline sleep study from 11/29/19 showed severe obstructive sleep apnea and nocturnal hypoxemia, with lower average oxygen saturations even during wakefulness, with a total AHI of 47.8/hour, REM AHI of 53.3/hour, supine AHI of 52.6/hour and O2 nadir of 67%.  The patient was placed on supplemental oxygen during wakefulness at 1 L/min and during sleep at 2 L/min. The patient endorsed the Epworth Sleepiness Scale at 6/24 points. The patient's weight 164 pounds with a height of 65 (inches), resulting in a BMI of 27.2 kg/m2. The patient's neck circumference measured 13.8 inches.  CURRENT MEDICATIONS: Calcium-Vit D, Zyrtec, Flexeril, Colace, Aimovig, Norco, MS Contin, Pamelor, Lyrica, Maxalt  PROCEDURE:  This is a multichannel digital polysomnogram utilizing the SomnoStar 11.2 system.  Electrodes and sensors were applied and monitored per AASM Specifications.   EEG, EOG, Chin and Limb EMG, were sampled at 200 Hz.  ECG, Snore and Nasal Pressure, Thermal Airflow, Respiratory Effort, CPAP Flow and Pressure, Oximetry was sampled at 50 Hz. Digital video and audio were recorded.      The patient was fitted with a small Mirage Quattro FFM. CPAP was initiated at 5 cm and titrated to 7 cm.  She had significant central apneas since the beginning of the titration.  She was therefore switched to BiPAP standard therapy at 9/5 centimeters with ongoing central apneas noted as the pressure was titrated from 9/5 centimeters to 11/7 centimeters, at which point her AHI was 27.3/h.  She was switched to BiPAP ST with a rate of 12, starting  pressure of 12/8 centimeters.  She was further titrated to a BiPAP ST of 7/13 centimeters.  AHI was elevated at 53.3/h, O2 nadir 78%, 50% central apneas noted at the time.  Given the elevated BiPAP ST pressure and ongoing desaturations as well as snoring noted on a higher BiPAP ST, the patient was finally switched to ASV starting with an EPAP of 5 cm with a final EPAP of 9 cm, minimum pressure support of 3, maximum pressure support of 15.  On the final ASV setting, her AHI was 2.4/h, supine non-REM sleep was achieved, O2 nadir 89%.  Supplemental oxygen was not utilized during the study.   Lights Out was at 21:59 and Lights On at 05:10. Total recording time (TRT) was 432 minutes, with a total sleep time (TST) of 365.5 minutes. The patient's sleep latency to persistent sleep was 57 minutes. REM latency was 122.5 minutes, which is delayed. The sleep efficiency was 84.6 %.    SLEEP ARCHITECTURE: WASO (Wake after sleep onset)  was 58.5 minutes with initially moderate, then mild and later minimal sleep fragmentation noted. There were 11.5 minutes in Stage N1, 133 minutes Stage N2, 178 minutes Stage N3 and 43 minutes in Stage REM.  The percentage of Stage N1 was 3.1%, Stage N2 was 36.4%, Stage N3 was 48.7%, which is increased, and Stage R (REM sleep) was 11.8%, which is reduced. The arousals were noted as: 95 were spontaneous, 0 were associated with PLMs, 91 were associated with respiratory events.  RESPIRATORY ANALYSIS:  There was a total of 227  respiratory events: 29 obstructive apneas, 151 central apneas and 20 mixed apneas with a total of 200 apneas and an apnea index (AHI) of 32.8 /hour. There were 27 hypopneas with a hypopnea index of 4.4/hour. The patient also had 0 respiratory event related arousals (RERAs).      The total APNEA/HYPOPNEA INDEX  (AHI) was 37.3 /hour and the total RESPIRATORY DISTURBANCE INDEX was 37.3 /hour  10 events occurred in REM sleep and 217 events in NREM. The REM AHI was 14. /hour  versus a non-REM AHI of 40.4 /hour.  The patient spent 268 minutes of total sleep time in the supine position and 98 minutes in non-supine. The supine AHI was 46.8, versus a non-supine AHI of 11.1.  OXYGEN SATURATION & C02:  The baseline 02 saturation was 90%, with the lowest being 65%. Time spent below 89% saturation equaled 73 minutes.  PERIODIC LIMB MOVEMENTS:  The patient had a total of 0 Periodic Limb Movements. The Periodic Limb Movement (PLM) index was 0 and the PLM Arousal index was 0 /hour.  Audio and video analysis did not show any abnormal or unusual movements, behaviors, phonations or vocalizations. The patient took no bathroom breaks. The EKG was in keeping with normal sinus rhythm.  Post-study, the patient indicated that sleep was the same as usual.   IMPRESSION:  1. Severe Obstructive Sleep Apnea (OSA) 2. Nocturnal hypoxemia 3. Inadequate treatment with CPAP 4. Central sleep apnea 5. Treatment emergent central apneas 6. Dysfunctions associated with sleep stages or arousal from sleep   RECOMMENDATIONS: 1. This study demonstrates significant improvement of the patient's obstructive and central sleep apnea with ASV.  CPAP therapy resulted in significant increase in central apneas and BiPAP therapy including standard BiPAP and BiPAP ST were not enough to resolve her desaturations and severe central sleep disordered breathing.  I will recommend home ASV therapy with an EPAP of 9 cm, minimum pressure support of 3, maximum pressure support of 15 cm of via small full facemask. The patient will be advised to be fully compliant with PAP therapy to improve sleep related symptoms and decrease long term cardiovascular risks. The patient should be reminded, that it may take up to 3 months to get fully used to using PAP with all planned sleep. The earlier full compliance is achieved, the better long term compliance tends to be.  2. Please note that untreated obstructive sleep apnea may carry  additional perioperative morbidity. Patients with significant obstructive sleep apnea should receive perioperative PAP therapy and the surgeons and particularly the anesthesiologist should be informed of the diagnosis and the severity of the sleep disordered breathing. 3. Sedating medication including narcotic pain medication should be used with extreme caution; narcotic pain medication should be avoided close to bedtime.  4. This study shows sleep fragmentation and abnormal sleep stage percentages; these are nonspecific findings and per se do not signify an intrinsic sleep disorder or a cause for the patient's sleep-related symptoms. Causes include (but are not limited to) the first night effect of the sleep study, circadian rhythm disturbances, medication effect or an underlying mood disorder or medical problem.  5. The patient should be cautioned not to drive, work at heights, or operate dangerous or heavy equipment when tired or sleepy. Review and reiteration of good sleep hygiene measures should be pursued with any patient. 6. The patient will be seen in follow-up in the sleep clinic at [facility] for discussion of the test results, symptom and treatment compliance review, further management strategies, etc.  The referring provider will be notified of the test results.  I certify that I have reviewed the entire raw data recording prior to the issuance of this report in accordance with the Standards of Accreditation of the American Academy of Sleep Medicine (AASM)   Huston Foley, MD, PhD Diplomat, American Board of Neurology and Sleep Medicine (Neurology and Sleep Medicine)

## 2020-01-16 NOTE — Progress Notes (Signed)
Patient referred by Dr. Marjory Lies, seen by me on 11/13/19, diagnostic PSG on 11/29/19. Patient had a CPAP titration study on 01/02/20.   Please call and inform patient that she had severe sleep apnea during the second study and CPAP alone was not effective. She was tried on another machine called BiPAP, which did not help enough and she had severe Central sleep apnea, which can be, in part, due to her medications she is on.  She eventually did well on a machine called ASV. I have entered an order for treatment with ASV at home. We will see the patient back in follow-up in about 10 weeks. Please also explain to the patient that I will be looking out for compliance data, which can be downloaded from the machine (stored on an SD card, that is inserted in the machine) or via remote access through a modem, that is built into the machine. At the time of the followup appointment we will discuss sleep study results and how it is going with PAP treatment at home.  I would, again, advise patient to work with her providers to reduce sedating and especially narcotic medication as much as possible.  Please also make sure, the patient has a follow-up appointment with me in about 10 weeks from the setup date, thanks. May see one of our nurse practitioners if needed for proper timing of the FU appointment.  Please fax or route the report to the referring provider. Thanks,   Huston Foley, MD, PhD [facility])

## 2020-01-16 NOTE — Telephone Encounter (Signed)
I called pt to discuss. No answer, left a message asking her to call me back. 

## 2020-01-16 NOTE — Addendum Note (Signed)
Addended by: Huston Foley on: 01/16/2020 10:21 AM   Modules accepted: Orders

## 2020-03-14 ENCOUNTER — Ambulatory Visit: Payer: Medicare Other | Admitting: Family Medicine

## 2020-05-07 NOTE — Progress Notes (Addendum)
PATIENT: Tracy Fox DOB: 04-30-1960  REASON FOR VISIT: follow up HISTORY FROM: patient  Chief Complaint  Patient presents with  . Obstructive Sleep Apnea    RM 1 alone PT is well, CPAP  helps her sleep once she got the appropriate mask to fit. Migraines are controllable, has about 1-2 a month on average      HISTORY OF PRESENT ILLNESS: 05/08/20 ALL:  Tracy Fox is a 60 y.o. female here today for follow up for headaches and OSA on ASV. She was seen by Dr Leta Baptist 09/2019 for new onset headaches with migrainous features. She endorsed snoring and morning headaches. She was started on Amovig 39m monthly and rizatriptan for acute management. Insurance would not cover Amovig so she was started on nortriptyline 261mQHS.  MRI was normal. Nocturnal polysomnography 10/20 showed severe complex with AHI 47.8/hr, REM AHI 53.3 and Os nadir 67%. Titration study 01/02/2020 showed worsening central apneas with CPAP, BiPAP not sufficient to manage and ASV therapy was ordered. She has switched to a nasal pillow. She is more comfortable with this mask. She is using ASV more consistently over the past week. Since, she feels that she is doing better. She feels more energized.  Bedtime varies. Headaches are significantly improved. Rizatriptan works very well for abortive therapy. She usually has 1-2 migraines each month.       HISTORY: (copied from Dr AtGuadelupe Fox note)  Dear Tracy Fox  I saw your patient, Tracy Peadenupon your kind request, in my Sleep clinic today for initial consultation of her sleep disorder, in particular, concern for underlying obstructive sleep apnea.  The patient is unaccompanied today.  As you know, Ms. Tracy Fox a [age]ear old right-handed woman with an underlying medical history of allergies, migraine headaches, lumbar degenerative disc disease, on chronic pain medication, and mildly overweight state, who reports snoring and chronic difficulty with her sleep, including difficulty  maintaining sleep.  She had a sleep study some 15 years ago which showed snoring but no significant sleep apnea.  She is followed by pain management for chronic back pain.  She is on several medications including Norco, MS Contin, Pamelor, Lyrica.  She also takes Flexeril.  For her migraines she is on Aimovig and Maxalt. Her Epworth sleepiness score is 6 out of 24, fatigue severity score is 49 out of 63.  She is single and lives alone.  She does not smoke, she drinks alcohol rarely, maybe up to twice a year.  She drinks caffeine in the form of soda, about 2 servings per day on average.  She reports that her pain management doctor also suggested she seek evaluation with a sleep study.  She does not wake up rested.  She is tired during the day.  Bedtime and rise time are quite variable but she may go to bed as late as 2 AM and rise time may be in the afternoon, may be as late as 2 PM.  She reports that her sleep cycle has changed.  She does not have nocturnal or recurrent morning headaches.  She reports significant nocturia about 3-4 times per average night.  She has a permanent retainer on the bottom.  She also has a bite guard which she does not use on a regular basis.  She has a history of tooth grinding.  She does have difficulty going to sleep and staying asleep.  Her brother has sleep apnea on a CPAP machine.   History (copied from [name]  previous note)  60 year old female here for evaluation of headaches.  Patient has history of headaches as a child but these were not officially diagnosed.  Later in life she went to headache specialist was diagnosed with migraine treated with topiramate and Imitrex.  Headaches fluctuated over time.  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  Since she retired, headaches had slightly improved.  In the past 1 year headaches have worsened.  She describes global severe headaches with  photophobia, scotoma, some nausea, lasting days at a time.  She averages 6 to 8 days of headache per month.  No specific triggering factors.  Patient also has chronic pain issues related to low back pain, history of spine surgery.  Patient also has snoring, daytime fatigue.  She had a sleep study many years ago which was unremarkable.  Symptoms are worse lately.  Also having wake up headaches.    REVIEW OF SYSTEMS: Out of a complete 14 system review of symptoms, the patient complains only of the following symptoms, headaches, chronic pain and all other reviewed systems are negative.   ALLERGIES: Allergies  Allergen Reactions  . Latex Other (See Comments)    Swelling with elastic bands in mouth during braces.  . Other     darvocet  . Prednisone     HOME MEDICATIONS: Outpatient Medications Prior to Visit  Medication Sig Dispense Refill  . CALCIUM-VITAMIN D PO Take by mouth. Takes 678m po 2 a day (VIACTIVE)    . Cetirizine HCl (ZYRTEC PO) Take 10 mg by mouth daily as needed.    . cyclobenzaprine (FLEXERIL) 10 MG tablet Take 10 mg by mouth daily as needed for muscle spasms.    .Marland Kitchendocusate sodium (COLACE) 100 MG capsule Take 100 mg by mouth in the morning, at noon, in the evening, and at bedtime.    .Eduard Roux(AIMOVIG) 70 MG/ML SOAJ Inject 70 mg into the skin every 30 (thirty) days. 3 mL 4  . HYDROcodone-acetaminophen (NORCO) 10-325 MG tablet Take 1 tablet by mouth 2 (two) times daily as needed.    .Marland Kitchenmorphine (MS CONTIN) 30 MG 12 hr tablet SMARTSIG:1 Tablet(s) By Mouth Every 12 Hours    . nortriptyline (PAMELOR) 10 MG capsule Take 2 capsules (20 mg total) by mouth at bedtime. 60 capsule 11  . pregabalin (LYRICA) 100 MG capsule Take 100 mg by mouth 2 (two) times daily.    . rizatriptan (MAXALT-MLT) 10 MG disintegrating tablet Take 1 tablet (10 mg total) by mouth as needed for migraine. May repeat in 2 hours if needed 9 tablet 11   No facility-administered medications prior to  visit.    PAST MEDICAL HISTORY: Past Medical History:  Diagnosis Date  . Bursitis    L shoulder  . Chronic pain syndrome   . Chronic shoulder pain    bilateral  . Failed back syndrome    lumbar  . Lumbar degenerative disc disease   . Lumbosacral spondylosis   . Migraine headache   . Rotator cuff syndrome of left shoulder   . Seasonal allergies     PAST SURGICAL HISTORY: Past Surgical History:  Procedure Laterality Date  . ABDOMINAL HYSTERECTOMY    . APPENDECTOMY    . GANGLION CYST EXCISION  2003  . LUMBAR LAMINECTOMY  2010  . NASAL SEPTUM SURGERY    . REFRACTIVE SURGERY    . TUMOR REMOVAL     from  arm  . WISDOM TOOTH EXTRACTION      FAMILY HISTORY: Family History  Problem Relation Age of Onset  . Ovarian cancer Mother   . Colon cancer Father     SOCIAL HISTORY: Social History   Socioeconomic History  . Marital status: Single    Spouse name: Not on file  . Number of children: 0  . Years of education: college  . Highest education level: Associate degree: academic program  Occupational History  . Occupation: Disabled  Tobacco Use  . Smoking status: Never Smoker  . Smokeless tobacco: Never Used  Substance and Sexual Activity  . Alcohol use: Yes    Comment: very rare  . Drug use: Never  . Sexual activity: Not on file  Other Topics Concern  . Not on file  Social History Narrative   Lives alone at home several cats.  DISABLED.  Education AAS degrees (2).  MTN DEW once daily. Right-handed.   Social Determinants of Health   Financial Resource Strain: Not on file  Food Insecurity: Not on file  Transportation Needs: Not on file  Physical Activity: Not on file  Stress: Not on file  Social Connections: Not on file  Intimate Partner Violence: Not on file     PHYSICAL EXAM  Vitals:   05/08/20 1447  BP: (!) 141/96  Pulse: 90  Weight: 173 lb (78.5 kg)  Height: _0  (1.651 m)   Body mass index is 28.79 kg/m.  Generalized: Well developed, in no  acute distress  Cardiology: normal rate and rhythm, no murmur noted Respiratory: clear to auscultation bilaterally  Neurological examination  Mentation: Alert oriented to time, place, history taking. Follows all commands speech and language fluent Cranial nerve II-XII: Pupils were equal round reactive to light. Extraocular movements were full, visual field were full  Motor: The motor testing reveals 5 over 5 strength of all 4 extremities. Good symmetric motor tone is noted throughout.  Gait and station: Gait is normal.    DIAGNOSTIC DATA (LABS, IMAGING, TESTING) - I reviewed patient records, labs, notes, testing and imaging myself where available.  No flowsheet data found.   Lab Results  Component Value Date   WBC 9.2 04/24/2008   HGB 11.7 (L) 04/24/2008   HCT 32.9 (L) 04/24/2008   MCV 89.6 04/24/2008   PLT 244 04/24/2008      Component Value Date/Time   NA 139 04/24/2008 0604   K 4.0 04/24/2008 0604   CL 99 04/24/2008 0604   CO2 32 04/24/2008 0604   GLUCOSE 120 (H) 04/24/2008 0604   BUN 10 04/24/2008 0604   CREATININE 0.84 04/24/2008 0604   CALCIUM 9.2 04/24/2008 0604   GFRNONAA >60 04/24/2008 0604   GFRAA  04/24/2008 0604    >60        The eGFR has been calculated using the MDRD equation. This calculation has not been validated in all clinical situations. eGFR's persistently <60 mL/min signify possible Chronic Kidney Disease.   No results found for: CHOL, HDL, LDLCALC, LDLDIRECT, TRIG, CHOLHDL No results found for: HGBA1C No results found for: VITAMINB12 No results found for: TSH   ASSESSMENT AND PLAN 60 y.o. year old female  has a past medical history of Bursitis, Chronic pain syndrome, Chronic shoulder pain, Failed back syndrome, Lumbar degenerative disc disease, Lumbosacral spondylosis, Migraine headache, Rotator cuff syndrome of left shoulder, and Seasonal allergies. here with     ICD-10-CM   1. 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 Encounter for  counseling on adaptive servo-ventilation (ASV) use  Z71.89      Deserai WYNDI NORTHRUP is doing fairly well on ASV therapy. Compliance report reveals excellent daily compliance but sub optimal 4 hour compliance. She feels that she is more comfortable with nasal pillow and compliance will continue to improve. I have reviewed elevated AHI. She was encouraged to continue using ASV nightly and for greater than 4 hours each night. Risks of untreated sleep apnea review and education materials provided. She will continue nortriptyline and rizatriptan for migraine management. Healthy lifestyle habits encouraged. She will follow up in 3-4 months, sooner if needed. She verbalizes understanding and agreement with this plan.   No orders of the defined types were placed in this encounter.    No orders of the defined types were placed in this encounter.     I spent 15 minutes with the patient. 50% of this time was spent counseling and educating patient on plan of care and medications.     Kinzie Wickes, FNP-C 05/08/2020, 3:17 PM [facility] [address] [place], [place] [address] [phone]  I reviewed the above note and documentation by the Nurse Practitioner and agree with the history, exam, assessment and plan as outlined above. I was available for consultation. Star Age, MD, PhD [facility])

## 2020-05-07 NOTE — Patient Instructions (Addendum)
Please continue using your ASV regularly. While your insurance requires that you use ASV at least 4 hours each night on 70% of the nights, I recommend, that you not skip any nights and use it throughout the night if you can. Getting used to ASV and staying with the treatment long term does take time and patience and discipline. Untreated obstructive sleep apnea when it is moderate to severe can have an adverse impact on cardiovascular health and raise her risk for heart disease, arrhythmias, hypertension, congestive heart failure, stroke and diabetes. Untreated obstructive sleep apnea causes sleep disruption, nonrestorative sleep, and sleep deprivation. This can have an impact on your day to day functioning and cause daytime sleepiness and impairment of cognitive function, memory loss, mood disturbance, and problems focussing. Using ASV regularly can improve these symptoms.  Continue nortriptyline 20mg  at bedtime and rizatriptan as needed for migraine abortion. Work on daily and 4 hour compliance. Follow up in 3-4 months   Sleep Apnea Sleep apnea affects breathing during sleep. It causes breathing to stop for a short time or to become shallow. It can also increase the risk of:  Heart attack.  Stroke.  Being very overweight (obese).  Diabetes.  Heart failure.  Irregular heartbeat. The goal of treatment is to help you breathe normally again. What are the causes? There are three kinds of sleep apnea:  Obstructive sleep apnea. This is caused by a blocked or collapsed airway.  Central sleep apnea. This happens when the brain does not send the right signals to the muscles that control breathing.  Mixed sleep apnea. This is a combination of obstructive and central sleep apnea. The most common cause of this condition is a collapsed or blocked airway. This can happen if:  Your throat muscles are too relaxed.  Your tongue and tonsils are too large.  You are overweight.  Your airway is too  small.   What increases the risk?  Being overweight.  Smoking.  Having a small airway.  Being older.  Being female.  Drinking alcohol.  Taking medicines to calm yourself (sedatives or tranquilizers).  Having family members with the condition. What are the signs or symptoms?  Trouble staying asleep.  Being sleepy or tired during the day.  Getting angry a lot.  Loud snoring.  Headaches in the morning.  Not being able to focus your mind (concentrate).  Forgetting things.  Less interest in sex.  Mood swings.  Personality changes.  Feelings of sadness (depression).  Waking up a lot during the night to pee (urinate).  Dry mouth.  Sore throat. How is this diagnosed?  Your medical history.  A physical exam.  A test that is done when you are sleeping (sleep study). The test is most often done in a sleep lab but may also be done at home. How is this treated?  Sleeping on your side.  Using a medicine to get rid of mucus in your nose (decongestant).  Avoiding the use of alcohol, medicines to help you relax, or certain pain medicines (narcotics).  Losing weight, if needed.  Changing your diet.  Not smoking.  Using a machine to open your airway while you sleep, such as: ? An oral appliance. This is a mouthpiece that shifts your lower jaw forward. ? A CPAP device. This device blows air through a mask when you breathe out (exhale). ? An EPAP device. This has valves that you put in each nostril. ? A BPAP device. This device blows air through a mask  when you breathe in (inhale) and breathe out.  Having surgery if other treatments do not work. It is important to get treatment for sleep apnea. Without treatment, it can lead to:  High blood pressure.  Coronary artery disease.  In men, not being able to have an erection (impotence).  Reduced thinking ability.   Follow these instructions at home: Lifestyle  Make changes that your doctor recommends.  Eat  a healthy diet.  Lose weight if needed.  Avoid alcohol, medicines to help you relax, and some pain medicines.  Do not use any products that contain nicotine or tobacco, such as cigarettes, e-cigarettes, and chewing tobacco. If you need help quitting, ask your doctor. General instructions  Take over-the-counter and prescription medicines only as told by your doctor.  If you were given a machine to use while you sleep, use it only as told by your doctor.  If you are having surgery, make sure to tell your doctor you have sleep apnea. You may need to bring your device with you.  Keep all follow-up visits as told by your doctor. This is important. Contact a doctor if:  The machine that you were given to use during sleep bothers you or does not seem to be working.  You do not get better.  You get worse. Get help right away if:  Your chest hurts.  You have trouble breathing in enough air.  You have an uncomfortable feeling in your back, arms, or stomach.  You have trouble talking.  One side of your body feels weak.  A part of your face is hanging down. These symptoms may be an emergency. Do not wait to see if the symptoms will go away. Get medical help right away. Call your local emergency services (911 in the [place] Do not drive yourself to the hospital. Summary  This condition affects breathing during sleep.  The most common cause is a collapsed or blocked airway.  The goal of treatment is to help you breathe normally while you sleep. This information is not intended to replace advice given to you by your health care provider. Make sure you discuss any questions you have with your health care provider. Document Revised: 11/12/2017 Document Reviewed: 09/21/2017 Elsevier Patient Education  2021 [employer].

## 2020-05-08 ENCOUNTER — Encounter: Payer: Self-pay | Admitting: Family Medicine

## 2020-05-08 ENCOUNTER — Ambulatory Visit (INDEPENDENT_AMBULATORY_CARE_PROVIDER_SITE_OTHER): Payer: Medicare Other | Admitting: Family Medicine

## 2020-05-08 VITALS — BP 141/96 | HR 90 | Ht 65.0 in | Wt 173.0 lb

## 2020-05-08 DIAGNOSIS — G4731 Primary central sleep apnea: Secondary | ICD-10-CM | POA: Diagnosis not present

## 2020-05-08 DIAGNOSIS — Z7189 Other specified counseling: Secondary | ICD-10-CM

## 2020-08-07 NOTE — Patient Instructions (Signed)
Please continue using your ASV regularly. While your insurance requires that you use ASV at least 4 hours each night on 70% of the nights, I recommend, that you not skip any nights and use it throughout the night if you can. Getting used to ASV and staying with the treatment long term does take time and patience and discipline. Untreated obstructive sleep apnea when it is moderate to severe can have an adverse impact on cardiovascular health and raise her risk for heart disease, arrhythmias, hypertension, congestive heart failure, stroke and diabetes. Untreated obstructive sleep apnea causes sleep disruption, nonrestorative sleep, and sleep deprivation. This can have an impact on your day to day functioning and cause daytime sleepiness and impairment of cognitive function, memory loss, mood disturbance, and problems focussing. Using ASV regularly can improve these symptoms.   Follow up in 1 year  

## 2020-08-07 NOTE — Progress Notes (Addendum)
PATIENT: Tracy Fox DOB: 1960/09/28  REASON FOR VISIT: follow up HISTORY FROM: patient  Chief Complaint  Patient presents with   Obstructive Sleep Apnea    Rm 2, alone. Here for cpap f/u, pt reports doing well w/ cpap therapy, unless she has been sick and is not able to keep it on.      [place]: headaches [place]: sleep  HISTORY OF PRESENT ILLNESS: 08/08/20 ALL:  She returns for follow up for complex sleep apnea on ASV and migraines. She is doing much better with ASV therapy. She is using it most every night and tries to use for at least 4 hours. She continues using a nasal pillow. She has noted a air leak. Migraines are well managed on nortriptyline 28m at bedtime. Rizatriptan     05/08/2020 ALL:  Johnny MLORIEANN ARGUETAis a 60y.o. female here today for follow up for headaches and OSA on ASV. She was seen by Dr PLeta Baptist8/2021 for new onset headaches with migrainous features. She endorsed snoring and morning headaches. She was started on Amovig 769mmonthly and rizatriptan for acute management. Insurance would not cover Amovig so she was started on nortriptyline 2027mHS.  MRI was normal. Nocturnal polysomnography 10/20 showed severe complex with AHI 47.8/hr, REM AHI 53.3 and Os nadir 67%. Titration study 01/02/2020 showed worsening central apneas with CPAP, BiPAP not sufficient to manage and ASV therapy was ordered. She has switched to a nasal pillow. She is more comfortable with this mask. She is using ASV more consistently over the past week. Since, she feels that she is doing better. She feels more energized.  Bedtime varies. Headaches are significantly improved. Rizatriptan works very well for abortive therapy. She usually has 1-2 migraines each month.       HISTORY: (copied from Dr AthGuadelupe Sabinevious note)  Dear Tracy Fox  I saw your patient, Tracy Fox your kind request, in my Sleep clinic today for initial consultation of her sleep disorder, in particular, concern for  underlying obstructive sleep apnea.  The patient is unaccompanied today.  As you know, Ms. Tracy Fox a 58 11ar old right-handed woman with an underlying medical history of allergies, migraine headaches, lumbar degenerative disc disease, on chronic pain medication, and mildly overweight state, who reports snoring and chronic difficulty with her sleep, including difficulty maintaining sleep.  She had a sleep study some 15 years ago which showed snoring but no significant sleep apnea.  She is followed by pain management for chronic back pain.  She is on several medications including Norco, MS Contin, Pamelor, Lyrica.  She also takes Flexeril.  For her migraines she is on Aimovig and Maxalt. Her Epworth sleepiness score is 6 out of 24, fatigue severity score is 49 out of 63.  She is single and lives alone.  She does not smoke, she drinks alcohol rarely, maybe up to twice a year.  She drinks caffeine in the form of soda, about 2 servings per day on average.  She reports that her pain management doctor also suggested she seek evaluation with a sleep study.  She does not wake up rested.  She is tired during the day.  Bedtime and rise time are quite variable but she may go to bed as late as 2 AM and rise time may be in the afternoon, may be as late as 2 PM.  She reports that her sleep cycle has changed.  She does not have nocturnal or recurrent morning headaches.  She  reports significant nocturia about 3-4 times per average night.  She has a permanent retainer on the bottom.  She also has a bite guard which she does not use on a regular basis.  She has a history of tooth grinding.  She does have difficulty going to sleep and staying asleep.  Her brother has sleep apnea on a CPAP machine.   History (copied from Dr [name] previous note)  60 year old female here for evaluation of headaches.   Patient has history of headaches as a child but these were not officially diagnosed.  Later in life she went to headache  specialist was diagnosed with migraine treated with topiramate and Imitrex.  Headaches fluctuated over time.  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  Since she retired, headaches had slightly improved.   In the past 1 year headaches have worsened.  She describes global severe headaches with photophobia, scotoma, some nausea, lasting days at a time.  She averages 6 to 8 days of headache per month.  No specific triggering factors.   Patient also has chronic pain issues related to low back pain, history of spine surgery.   Patient also has snoring, daytime fatigue.  She had a sleep study many years ago which was unremarkable.  Symptoms are worse lately.  Also having wake up headaches.   REVIEW OF SYSTEMS: Out of a complete 14 system review of symptoms, the patient complains only of the following symptoms, headaches, chronic pain and all other reviewed systems are negative.   ALLERGIES: Allergies  Allergen Reactions   Latex Other (See Comments)    Swelling with elastic bands in mouth during braces.   Neurontin [Gabapentin]     Over 8 years, pt unsure what reaction she had, night terrors or hallucination    Other     darvocet   Prednisone     HOME MEDICATIONS: Outpatient Medications Prior to Visit  Medication Sig Dispense Refill   CALCIUM-VITAMIN D PO Take by mouth. Takes 659m po 2 a day (VIACTIVE)     Cetirizine HCl (ZYRTEC PO) Take 10 mg by mouth daily as needed.     docusate sodium (COLACE) 100 MG capsule Take 100 mg by mouth in the morning, at noon, in the evening, and at bedtime.     HYDROcodone-acetaminophen (NORCO) 10-325 MG tablet Take 1 tablet by mouth daily as needed.     morphine (MS CONTIN) 30 MG 12 hr tablet SMARTSIG:1 Tablet(s) By Mouth Every 12 Hours     pregabalin (LYRICA) 100 MG capsule Take 100 mg by mouth 2 (two) times daily.     cyclobenzaprine (FLEXERIL) 10 MG tablet Take 10 mg by mouth  daily as needed for muscle spasms.     Erenumab-aooe (AIMOVIG) 70 MG/ML SOAJ Inject 70 mg into the skin every 30 (thirty) days. 3 mL 4   nortriptyline (PAMELOR) 10 MG capsule Take 2 capsules (20 mg total) by mouth at bedtime. 60 capsule 11   rizatriptan (MAXALT-MLT) 10 MG disintegrating tablet Take 1 tablet (10 mg total) by mouth as needed for migraine. May repeat in 2 hours if needed 9 tablet 11   No facility-administered medications prior to visit.    PAST MEDICAL HISTORY: Past Medical History:  Diagnosis Date   Bursitis    L shoulder   Chronic pain syndrome    Chronic shoulder pain    bilateral   Failed back syndrome  lumbar   Lumbar degenerative disc disease    Lumbosacral spondylosis    Migraine headache    Rotator cuff syndrome of left shoulder    Seasonal allergies     PAST SURGICAL HISTORY: Past Surgical History:  Procedure Laterality Date   ABDOMINAL HYSTERECTOMY     APPENDECTOMY     GANGLION CYST EXCISION  2003   LUMBAR LAMINECTOMY  2010   NASAL SEPTUM SURGERY     REFRACTIVE SURGERY     TUMOR REMOVAL     from arm   WISDOM TOOTH EXTRACTION      FAMILY HISTORY: Family History  Problem Relation Age of Onset   Ovarian cancer Mother    Colon cancer Father     SOCIAL HISTORY: Social History   Socioeconomic History   Marital status: Single    Spouse name: Not on file   Number of children: 0   Years of education: college   Highest education level: Associate degree: academic program  Occupational History   Occupation: Disabled  Tobacco Use   Smoking status: Never   Smokeless tobacco: Never  Substance and Sexual Activity   Alcohol use: Yes    Comment: very rare   Drug use: Never   Sexual activity: Not on file  Other Topics Concern   Not on file  Social History Narrative   Lives alone at home several cats.  DISABLED.  Education AAS degrees (2).  MTN DEW once daily. Right-handed.   Social Determinants of Health   Financial Resource Strain: Not  on file  Food Insecurity: Not on file  Transportation Needs: Not on file  Physical Activity: Not on file  Stress: Not on file  Social Connections: Not on file  Intimate Partner Violence: Not on file     PHYSICAL EXAM  Vitals:   08/08/20 1418  BP: (!) 142/84  Pulse: (!) 106  Weight: 178 lb (80.7 kg)  Height: 5' 5" (1.651 m)    Body mass index is 29.62 kg/m.  Generalized: Well developed, in no acute distress  Cardiology: normal rate and rhythm, no murmur noted Respiratory: clear to auscultation bilaterally  Neurological examination  Mentation: Alert oriented to time, place, history taking. Follows all commands speech and language fluent Cranial nerve II-XII: Pupils were equal round reactive to light. Extraocular movements were full, visual field were full  Motor: The motor testing reveals 5 over 5 strength of all 4 extremities. Good symmetric motor tone is noted throughout.  Gait and station: Gait is normal.    DIAGNOSTIC DATA (LABS, IMAGING, TESTING) - I reviewed patient records, labs, notes, testing and imaging myself where available.  No flowsheet data found.   Lab Results  Component Value Date   WBC 9.2 04/24/2008   HGB 11.7 (L) 04/24/2008   HCT 32.9 (L) 04/24/2008   MCV 89.6 04/24/2008   PLT 244 04/24/2008      Component Value Date/Time   NA 139 04/24/2008 0604   K 4.0 04/24/2008 0604   CL 99 04/24/2008 0604   CO2 32 04/24/2008 0604   GLUCOSE 120 (H) 04/24/2008 0604   BUN 10 04/24/2008 0604   CREATININE 0.84 04/24/2008 0604   CALCIUM 9.2 04/24/2008 0604   GFRNONAA >60 04/24/2008 0604   GFRAA  04/24/2008 0604    >60        The eGFR has been calculated using the MDRD equation. This calculation has not been validated in all clinical situations. eGFR's persistently <60 mL/min signify possible Chronic Kidney Disease.  No results found for: CHOL, HDL, LDLCALC, LDLDIRECT, TRIG, CHOLHDL No results found for: HGBA1C No results found for: VITAMINB12 No  results found for: TSH   ASSESSMENT AND PLAN 60 y.o. year old female  has a past medical history of Bursitis, Chronic pain syndrome, Chronic shoulder pain, Failed back syndrome, Lumbar degenerative disc disease, Lumbosacral spondylosis, Migraine headache, Rotator cuff syndrome of left shoulder, and Seasonal allergies. here with     ICD-10-CM   1. Complex sleep apnea syndrome  G47.31 For home use only DME [facility]    2. Encounter for counseling on adaptive servo-ventilation (ASV) use  Z71.89 For home use only DME [facility]    3. Recurrent headache  R51.9         Isella KAZIAH KRIZEK is doing fairly well on ASV therapy. Compliance report reveals excellent daily compliance and acceptable 4 hour compliance. She will continue to monitor for large air leak at home. AHI is well managed. She was encouraged to continue using ASV nightly and for greater than 4 hours each night. Risks of untreated sleep apnea review and education materials provided. She will continue nortriptyline and rizatriptan for migraine management. Healthy lifestyle habits encouraged. She will follow up in 1 year, sooner if needed. She verbalizes understanding and agreement with this plan.   Orders Placed This Encounter  Procedures   For home use only DME [facility]    Supplies    Order Specific Question:   Length of Need    Answer:   Lifetime    Order Specific Question:   Inspiratory pressure    Answer:   OTHER SEE COMMENTS    Order Specific Question:   Expiratory pressure    Answer:   OTHER SEE COMMENTS      Meds ordered this encounter  Medications   nortriptyline (PAMELOR) 10 MG capsule    Sig: Take 2 capsules (20 mg total) by mouth at bedtime.    Dispense:  180 capsule    Refill:  3    Order Specific Question:   Supervising Provider    Answer:   Melvenia Beam [[id number]]   rizatriptan (MAXALT-MLT) 10 MG disintegrating tablet    Sig: Take 1 tablet (10 mg total) by mouth as needed for migraine. May repeat in 2 hours if needed     Dispense:  9 tablet    Refill:  11    Order Specific Question:   Supervising Provider    Answer:   Melvenia Beam [[id number]]       [place], FNP-C 08/08/2020, 3:00 PM [facility] [address] [place], [place] [address] [phone]  I reviewed the above note and documentation by the Nurse Practitioner and agree with the history, exam, assessment and plan as outlined above. I was available for consultation. Star Age, MD, PhD [facility])

## 2020-08-08 ENCOUNTER — Ambulatory Visit (INDEPENDENT_AMBULATORY_CARE_PROVIDER_SITE_OTHER): Payer: Medicare Other | Admitting: Family Medicine

## 2020-08-08 ENCOUNTER — Encounter: Payer: Self-pay | Admitting: Family Medicine

## 2020-08-08 VITALS — BP 142/84 | HR 106 | Ht 65.0 in | Wt 178.0 lb

## 2020-08-08 DIAGNOSIS — G4731 Primary central sleep apnea: Secondary | ICD-10-CM

## 2020-08-08 DIAGNOSIS — R519 Headache, unspecified: Secondary | ICD-10-CM

## 2020-08-08 DIAGNOSIS — Z7189 Other specified counseling: Secondary | ICD-10-CM

## 2020-08-08 MED ORDER — NORTRIPTYLINE HCL 10 MG PO CAPS: 20.0000 mg | ORAL_CAPSULE | Freq: Every day | ORAL | 3 refills | Status: DC

## 2020-08-08 MED ORDER — RIZATRIPTAN BENZOATE 10 MG PO TBDP: 10.0000 mg | ORAL_TABLET | ORAL | 11 refills | Status: DC | PRN

## 2020-08-15 NOTE — Progress Notes (Signed)
CM sent to [facility] 

## 2021-05-19 ENCOUNTER — Ambulatory Visit
Admission: RE | Admit: 2021-05-19 | Discharge: 2021-05-19 | Disposition: A | Discharge status: Home / Self Care | Payer: Medicare Other | Source: Ambulatory Visit | Attending: Physician Assistant | Admitting: Physician Assistant

## 2021-05-19 ENCOUNTER — Other Ambulatory Visit: Payer: Self-pay | Admitting: Physician Assistant

## 2021-05-19 DIAGNOSIS — M545 Low back pain, unspecified: Secondary | ICD-10-CM

## 2021-06-24 ENCOUNTER — Telehealth: Payer: Self-pay | Admitting: Family Medicine

## 2021-06-24 NOTE — Telephone Encounter (Signed)
LVM and sent mychart msg informing pt of r/s needed for 7/6 appt- Emmilynn out. ?

## 2021-08-14 ENCOUNTER — Ambulatory Visit: Payer: Medicare Other | Admitting: Family Medicine

## 2021-08-20 ENCOUNTER — Encounter: Payer: Self-pay | Admitting: Family Medicine

## 2021-08-20 ENCOUNTER — Ambulatory Visit (INDEPENDENT_AMBULATORY_CARE_PROVIDER_SITE_OTHER): Payer: Medicare Other | Admitting: Family Medicine

## 2021-08-20 VITALS — BP 122/78 | HR 102 | Ht 65.0 in | Wt 186.0 lb

## 2021-08-20 DIAGNOSIS — G4731 Primary central sleep apnea: Secondary | ICD-10-CM

## 2021-08-20 DIAGNOSIS — R519 Headache, unspecified: Secondary | ICD-10-CM | POA: Diagnosis not present

## 2021-08-20 DIAGNOSIS — Z7189 Other specified counseling: Secondary | ICD-10-CM | POA: Diagnosis not present

## 2021-08-20 MED ORDER — NORTRIPTYLINE HCL 10 MG PO CAPS: 20.0000 mg | ORAL_CAPSULE | Freq: Every day | ORAL | 3 refills | Status: DC

## 2021-08-20 MED ORDER — RIZATRIPTAN BENZOATE 10 MG PO TBDP: 10.0000 mg | ORAL_TABLET | ORAL | 11 refills | Status: DC | PRN

## 2021-08-20 NOTE — Patient Instructions (Addendum)
Please continue using your ASV regularly. While your insurance requires that you use ASV at least 4 hours each night on 70% of the nights, I recommend, that you not skip any nights and use it throughout the night if you can. Getting used to ASV and staying with the treatment long term does take time and patience and discipline. Untreated obstructive sleep apnea when it is moderate to severe can have an adverse impact on cardiovascular health and raise her risk for heart disease, arrhythmias, hypertension, congestive heart failure, stroke and diabetes. Untreated obstructive sleep apnea causes sleep disruption, nonrestorative sleep, and sleep deprivation. This can have an impact on your day to day functioning and cause daytime sleepiness and impairment of cognitive function, memory loss, mood disturbance, and problems focussing. Using ASV regularly can improve these symptoms.  Continue nortriptyline 20mg  at bedtime and rizatriptan as needed. Please keep an eye on your BP. Try to get back in with PCP for follow up.   Follow up in 1 year

## 2021-08-20 NOTE — Progress Notes (Signed)
CM sent to [facility] for new order ?

## 2021-08-20 NOTE — Progress Notes (Signed)
PATIENT: Tracy Fox DOB: 28-Dec-1960  REASON FOR VISIT: follow up HISTORY FROM: patient  Chief Complaint  Patient presents with   Follow-up    RM 1, alone. Last seen 08/08/20. Tolerating CPAP well, using nasal pillow for mask.      [place]: headaches [place]: sleep  HISTORY OF PRESENT ILLNESS:  08/20/21 ALL:  Tracy Fox returns for follow up for OSA on ASV and headaches. She continues nortriptyline 54m QHS and rizatriptan as needed. She reports headaches are very well managed. She may have 1-2 headaches a month. She can not remember the last time she took rizatriptan.   She is doing well on ASV therapy. She is using her machine every night for about 7.5hr. She denies concerns with machine or supplies. She is working with pain management ([facility]) to wean pain medications. She has discontinued MS Contin. She is working on lowering dose of Lyrica. She reports BP has been elevated since gaining 20 pounds over the past year. She lost her brother unexpectedly. She has not seen PCP recently.     08/08/2020 ALL: She returns for follow up for complex sleep apnea on ASV and migraines. She is doing much better with ASV therapy. She is using it most every night and tries to use for at least 4 hours. She continues using a nasal pillow. She has noted a air leak. Migraines are well managed on nortriptyline 244mat bedtime. Rizatriptan     05/08/2020 ALL:  Tracy Fox a [age].o. female here today for follow up for headaches and OSA on ASV. She was seen by Dr PeLeta Baptist/2021 for new onset headaches with migrainous features. She endorsed snoring and morning headaches. She was started on Amovig 7062monthly and rizatriptan for acute management. Insurance would not cover Amovig so she was started on nortriptyline 44m43mS.  MRI was normal. Nocturnal polysomnography 10/20 showed severe complex with AHI 47.8/hr, REM AHI 53.3 and Os nadir 67%. Titration study 01/02/2020 showed worsening central  apneas with CPAP, BiPAP not sufficient to manage and ASV therapy was ordered. She has switched to a nasal pillow. She is more comfortable with this mask. She is using ASV more consistently over the past week. Since, she feels that she is doing better. She feels more energized.  Bedtime varies. Headaches are significantly improved. Rizatriptan works very well for abortive therapy. She usually has 1-2 migraines each month.       HISTORY: (copied from Dr AthaGuadelupe Sabinvious note)  Dear VikrBonnita Levan I saw your patient, Tracy Sharene Krikorianon your kind request, in my Sleep clinic today for initial consultation of her sleep disorder, in particular, concern for underlying obstructive sleep apnea.  The patient is unaccompanied today.  As you know, Ms. NashBlacketera 58 y88r old right-handed woman with an underlying medical history of allergies, migraine headaches, lumbar degenerative disc disease, on chronic pain medication, and mildly overweight state, who reports snoring and chronic difficulty with her sleep, including difficulty maintaining sleep.  She had a sleep study some 15 years ago which showed snoring but no significant sleep apnea.  She is followed by pain management for chronic back pain.  She is on several medications including Norco, MS Contin, Pamelor, Lyrica.  She also takes Flexeril.  For her migraines she is on Aimovig and Maxalt. Her Epworth sleepiness score is 6 out of 24, fatigue severity score is 49 out of 63.  She is single and lives alone.  She does  not smoke, she drinks alcohol rarely, maybe up to twice a year.  She drinks caffeine in the form of soda, about 2 servings per day on average.  She reports that her pain management doctor also suggested she seek evaluation with a sleep study.  She does not wake up rested.  She is tired during the day.  Bedtime and rise time are quite variable but she may go to bed as late as 2 AM and rise time may be in the afternoon, may be as late as 2 PM.  She reports that  her sleep cycle has changed.  She does not have nocturnal or recurrent morning headaches.  She reports significant nocturia about 3-4 times per average night.  She has a permanent retainer on the bottom.  She also has a bite guard which she does not use on a regular basis.  She has a history of tooth grinding.  She does have difficulty going to sleep and staying asleep.  Her brother has sleep apnea on a CPAP machine.   History (copied from Dr [name] previous note)  61 year old female here for evaluation of headaches.   Patient has history of headaches as a child but these were not officially diagnosed.  Later in life she went to headache specialist was diagnosed with migraine treated with topiramate and Imitrex.  Headaches fluctuated over time.  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  Since she retired, headaches had slightly improved.   In the past 1 year headaches have worsened.  She describes global severe headaches with photophobia, scotoma, some nausea, lasting days at a time.  She averages 6 to 8 days of headache per month.  No specific triggering factors.   Patient also has chronic pain issues related to low back pain, history of spine surgery.   Patient also has snoring, daytime fatigue.  She had a sleep study many years ago which was unremarkable.  Symptoms are worse lately.  Also having wake up headaches.   REVIEW OF SYSTEMS: Out of a complete 14 system review of symptoms, the patient complains only of the following symptoms, headaches, chronic pain and all other reviewed systems are negative.  ESS:1/24   ALLERGIES: Allergies  Allergen Reactions   Latex Other (See Comments)    Swelling with elastic bands in mouth during braces.   Neurontin [Gabapentin]     Over 8 years, pt unsure what reaction she had, night terrors or hallucination    Other     darvocet   Prednisone     HOME  MEDICATIONS: Outpatient Medications Prior to Visit  Medication Sig Dispense Refill   CALCIUM-VITAMIN D PO Take by mouth. Takes 636m po 2 a day (VIACTIVE)     Cetirizine HCl (ZYRTEC PO) Take 10 mg by mouth daily as needed.     HYDROcodone-acetaminophen (NORCO) 10-325 MG tablet Take 0.5 tablets by mouth daily as needed.     pregabalin (LYRICA) 100 MG capsule Take 100 mg by mouth 2 (two) times daily.     nortriptyline (PAMELOR) 10 MG capsule Take 2 capsules (20 mg total) by mouth at bedtime. 180 capsule 3   rizatriptan (MAXALT-MLT) 10 MG disintegrating tablet Take 1 tablet (10 mg total) by mouth as needed for migraine. May repeat in 2 hours if needed 9 tablet 11   docusate sodium (COLACE) 100 MG capsule Take 100 mg by mouth in the morning, at noon, in  the evening, and at bedtime.     morphine (MS CONTIN) 30 MG 12 hr tablet SMARTSIG:1 Tablet(s) By Mouth Every 12 Hours     No facility-administered medications prior to visit.    PAST MEDICAL HISTORY: Past Medical History:  Diagnosis Date   Bursitis    L shoulder   Chronic pain syndrome    Chronic shoulder pain    bilateral   Failed back syndrome    lumbar   Lumbar degenerative disc disease    Lumbosacral spondylosis    Migraine headache    Rotator cuff syndrome of left shoulder    Seasonal allergies     PAST SURGICAL HISTORY: Past Surgical History:  Procedure Laterality Date   ABDOMINAL HYSTERECTOMY     APPENDECTOMY     GANGLION CYST EXCISION  2003   LUMBAR LAMINECTOMY  2010   NASAL SEPTUM SURGERY     REFRACTIVE SURGERY     TUMOR REMOVAL     from arm   WISDOM TOOTH EXTRACTION      FAMILY HISTORY: Family History  Problem Relation Age of Onset   Ovarian cancer Mother    Colon cancer Father     SOCIAL HISTORY: Social History   Socioeconomic History   Marital status: Single    Spouse name: Not on file   Number of children: 0   Years of education: college   Highest education level: Associate degree: academic program   Occupational History   Occupation: Disabled  Tobacco Use   Smoking status: Never   Smokeless tobacco: Never  Substance and Sexual Activity   Alcohol use: Yes    Comment: very rare   Drug use: Never   Sexual activity: Not on file  Other Topics Concern   Not on file  Social History Narrative   Lives alone at home several cats.  DISABLED.  Education AAS degrees (2).  MTN DEW once daily. Right-handed.   Social Determinants of Health   Financial Resource Strain: Not on file  Food Insecurity: Not on file  Transportation Needs: Not on file  Physical Activity: Not on file  Stress: Not on file  Social Connections: Not on file  Intimate Partner Violence: Not on file     PHYSICAL EXAM  Vitals:   08/20/21 1438 08/20/21 1506  BP: (!) 152/102 122/78  Pulse: (!) 102   Weight: 186 lb (84.4 kg)   Height: _0  (1.651 m)      Body mass index is 30.95 kg/m.  Generalized: Well developed, in no acute distress  Cardiology: normal rate and rhythm, no murmur noted Respiratory: clear to auscultation bilaterally  Neurological examination  Mentation: Alert oriented to time, place, history taking. Follows all commands speech and language fluent Cranial nerve II-XII: Pupils were equal round reactive to light. Extraocular movements were full, visual field were full  Motor: The motor testing reveals 5 over 5 strength of all 4 extremities. Good symmetric motor tone is noted throughout.  Gait and station: Gait is normal.    DIAGNOSTIC DATA (LABS, IMAGING, TESTING) - I reviewed patient records, labs, notes, testing and imaging myself where available.      No data to display           Lab Results  Component Value Date   WBC 9.2 04/24/2008   HGB 11.7 (L) 04/24/2008   HCT 32.9 (L) 04/24/2008   MCV 89.6 04/24/2008   PLT 244 04/24/2008      Component Value Date/Time   NA 139 04/24/2008  0604   K 4.0 04/24/2008 0604   CL 99 04/24/2008 0604   CO2 32 04/24/2008 0604   GLUCOSE 120  (H) 04/24/2008 0604   BUN 10 04/24/2008 0604   CREATININE 0.84 04/24/2008 0604   CALCIUM 9.2 04/24/2008 0604   GFRNONAA >60 04/24/2008 0604   GFRAA  04/24/2008 0604    >60        The eGFR has been calculated using the MDRD equation. This calculation has not been validated in all clinical situations. eGFR's persistently <60 mL/min signify possible Chronic Kidney Disease.   No results found for: "CHOL", "HDL", "LDLCALC", "LDLDIRECT", "TRIG", "CHOLHDL" No results found for: "HGBA1C" No results found for: "VITAMINB12" No results found for: "TSH"   ASSESSMENT AND PLAN 61 y.o. year old female  has a past medical history of Bursitis, Chronic pain syndrome, Chronic shoulder pain, Failed back syndrome, Lumbar degenerative disc disease, Lumbosacral spondylosis, Migraine headache, Rotator cuff syndrome of left shoulder, and Seasonal allergies. here with     ICD-10-CM   1. Complex sleep apnea syndrome  G47.31 For home use only DME [facility]    2. Encounter for counseling on adaptive servo-ventilation (ASV) use  Z71.89 For home use only DME [facility]    3. Recurrent headache  R51.9       Tracy Fox is doing well on ASV therapy. Compliance report reveals excellent daily and 4 hour compliance. She will continue to monitor for large air leak at home. AHI is well managed. She was encouraged to continue using ASV nightly and for greater than 4 hours each night. Risks of untreated sleep apnea review and education materials provided. She will continue nortriptyline and rizatriptan for migraine management. Healthy lifestyle habits encouraged. She will follow up in 1 year, sooner if needed. She verbalizes understanding and agreement with this plan.   Orders Placed This Encounter  Procedures   For home use only DME [facility]    Supplies    Order Specific Question:   Length of Need    Answer:   Lifetime    Order Specific Question:   Inspiratory pressure    Answer:   OTHER SEE COMMENTS    Order Specific  Question:   Expiratory pressure    Answer:   OTHER SEE COMMENTS      Meds ordered this encounter  Medications   nortriptyline (PAMELOR) 10 MG capsule    Sig: Take 2 capsules (20 mg total) by mouth at bedtime.    Dispense:  180 capsule    Refill:  3    Order Specific Question:   Supervising Provider    Answer:   Melvenia Beam [[id number]]   rizatriptan (MAXALT-MLT) 10 MG disintegrating tablet    Sig: Take 1 tablet (10 mg total) by mouth as needed for migraine. May repeat in 2 hours if needed    Dispense:  9 tablet    Refill:  11    Order Specific Question:   Supervising Provider    Answer:   Melvenia Beam [[id number]]       MEB RAXEN, FNP-C 08/20/2021, 3:36 PM [facility] [address], [place], [place] [address] [phone]

## 2021-08-21 ENCOUNTER — Other Ambulatory Visit: Payer: Self-pay | Admitting: Neurology

## 2021-08-21 ENCOUNTER — Encounter: Payer: Self-pay | Admitting: Neurology

## 2022-08-24 ENCOUNTER — Encounter: Payer: Self-pay | Admitting: Family Medicine

## 2022-08-24 ENCOUNTER — Ambulatory Visit (INDEPENDENT_AMBULATORY_CARE_PROVIDER_SITE_OTHER): Payer: Medicare Other | Admitting: Family Medicine

## 2022-08-24 VITALS — BP 144/88 | HR 113 | Ht 65.5 in | Wt 197.6 lb

## 2022-08-24 DIAGNOSIS — R519 Headache, unspecified: Secondary | ICD-10-CM | POA: Diagnosis not present

## 2022-08-24 DIAGNOSIS — G4731 Primary central sleep apnea: Secondary | ICD-10-CM | POA: Diagnosis not present

## 2022-08-24 NOTE — Patient Instructions (Addendum)
Please continue using your ASV regularly. While your insurance requires that you use ASV at least 4 hours each night on 70% of the nights, I recommend, that you not skip any nights and use it throughout the night if you can. Getting used to ASV and staying with the treatment long term does take time and patience and discipline. Untreated obstructive sleep apnea when it is moderate to severe can have an adverse impact on cardiovascular health and raise her risk for heart disease, arrhythmias, hypertension, congestive heart failure, stroke and diabetes. Untreated obstructive sleep apnea causes sleep disruption, nonrestorative sleep, and sleep deprivation. This can have an impact on your day to day functioning and cause daytime sleepiness and impairment of cognitive function, memory loss, mood disturbance, and problems focussing. Using ASV regularly can improve these symptoms.  We will update supply orders, today. Continue nortriptyline 20mg  at bedtime and rizatriptan as needed.   Please check in with PCP. Keep an eye on your blood pressure and pulse. You may want to have them check blood work to check for any metabolic causes of weight gain.   Follow up in 1 year

## 2022-08-24 NOTE — Progress Notes (Signed)
PATIENT: Tracy Fox DOB: November 15, 1960  REASON FOR VISIT: follow up HISTORY FROM: patient  Chief Complaint  Patient presents with   Follow-up    Rm 2, alone,  cpap follow up. Doing ok.      [place]: headaches [place]: sleep  HISTORY OF PRESENT ILLNESS:  08/24/22 ALL:  Ayden continues for follow up for OSA on ASV and headaches. She was last seen 08/2021 and doing well on nortriptyline 20mg  at bedtime and rizatriptan PRN. Since, she reports doing well. She did have an increase in headache days around the beginning of the year but they seems to have stabilized. She continues to tolerate meds well. She has not used rizatriptan recently. She has gained sone weight. She contributes this to stress and inactivity. She has not seen PCP. She does not routinely get physicals. BP is usually 110-120/80's but has been elevated since weight gain. She feels pulse is elevated due to heat. She tries to avoid going outside when it is hot. She stays well hydrated. She denies chest pain or shortness of breath.     08/20/2021 ALL: Tracy Fox returns for follow up for OSA on ASV and headaches. She continues nortriptyline 20mg  QHS and rizatriptan as needed. She reports headaches are very well managed. She may have 1-2 headaches a month. She can not remember the last time she took rizatriptan.   She is doing well on ASV therapy. She is using her machine every night for about 7.5hr. She denies concerns with machine or supplies. She is working with pain management ([facility]) to wean pain medications. She has discontinued MS Contin. She is working on lowering dose of Lyrica. She reports BP has been elevated since gaining 20 pounds over the past year. She lost her brother unexpectedly. She has not seen PCP recently.     08/08/2020 ALL: She returns for follow up for complex sleep apnea on ASV and migraines. She is doing much better with ASV therapy. She is using it most every night and tries to use for at least 4  hours. She continues using a nasal pillow. She has noted a air leak. Migraines are well managed on nortriptyline 20mg  at bedtime. Rizatriptan     05/08/2020 ALL:  Tracy Fox is a 62 y.o. female here today for follow up for headaches and OSA on ASV. She was seen by Dr Marjory Lies 09/2019 for new onset headaches with migrainous features. She endorsed snoring and morning headaches. She was started on Amovig 70mg  monthly and rizatriptan for acute management. Insurance would not cover Amovig so she was started on nortriptyline 20mg  QHS.  MRI was normal. Nocturnal polysomnography 10/20 showed severe complex with AHI 47.8/hr, REM AHI 53.3 and Os nadir 67%. Titration study 01/02/2020 showed worsening central apneas with CPAP, BiPAP not sufficient to manage and ASV therapy was ordered. She has switched to a nasal pillow. She is more comfortable with this mask. She is using ASV more consistently over the past week. Since, she feels that she is doing better. She feels more energized.  Bedtime varies. Headaches are significantly improved. Rizatriptan works very well for abortive therapy. She usually has 1-2 migraines each month.       HISTORY: (copied from Dr Teofilo Pod previous note)  Dear [name],    I saw your patient, Tracy Fox, upon your kind request, in my Sleep clinic today for initial consultation of her sleep disorder, in particular, concern for underlying obstructive sleep apnea.  The patient is unaccompanied  today.  As you know, Tracy Fox is a 62 year old right-handed woman with an underlying medical history of allergies, migraine headaches, lumbar degenerative disc disease, on chronic pain medication, and mildly overweight state, who reports snoring and chronic difficulty with her sleep, including difficulty maintaining sleep.  She had a sleep study some 15 years ago which showed snoring but no significant sleep apnea.  She is followed by pain management for chronic back pain.  She is on several medications  including Norco, MS Contin, Pamelor, Lyrica.  She also takes Flexeril.  For her migraines she is on Aimovig and Maxalt. Her Epworth sleepiness score is 6 out of 24, fatigue severity score is 49 out of 63.  She is single and lives alone.  She does not smoke, she drinks alcohol rarely, maybe up to twice a year.  She drinks caffeine in the form of soda, about 2 servings per day on average.  She reports that her pain management doctor also suggested she seek evaluation with a sleep study.  She does not wake up rested.  She is tired during the day.  Bedtime and rise time are quite variable but she may go to bed as late as 2 AM and rise time may be in the afternoon, may be as late as 2 PM.  She reports that her sleep cycle has changed.  She does not have nocturnal or recurrent morning headaches.  She reports significant nocturia about 3-4 times per average night.  She has a permanent retainer on the bottom.  She also has a bite guard which she does not use on a regular basis.  She has a history of tooth grinding.  She does have difficulty going to sleep and staying asleep.  Her brother has sleep apnea on a CPAP machine.   History (copied from Dr Richrd Humbles previous note)  62 year old female here for evaluation of headaches.   Patient has history of headaches as a child but these were not officially diagnosed.  Later in life she went to headache specialist was diagnosed with migraine treated with topiramate and Imitrex.  Headaches fluctuated over time.  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  Since she retired, headaches had slightly improved.   In the past 1 year headaches have worsened.  She describes global severe headaches with photophobia, scotoma, some nausea, lasting days at a time.  She averages 6 to 8 days of headache per month.  No specific triggering factors.   Patient also has chronic pain issues related to low back  pain, history of spine surgery.   Patient also has snoring, daytime fatigue.  She had a sleep study many years ago which was unremarkable.  Symptoms are worse lately.  Also having wake up headaches.   REVIEW OF SYSTEMS: Out of a complete 14 system review of symptoms, the patient complains only of the following symptoms, headaches, chronic pain and all other reviewed systems are negative.  ESS:1/24   ALLERGIES: Allergies  Allergen Reactions   Latex Other (See Comments)    Swelling with elastic bands in mouth during braces.   Neurontin [Gabapentin]     Over 8 years, pt unsure what reaction she had, night terrors or hallucination    Other     darvocet   Prednisone     HOME MEDICATIONS: Outpatient Medications Prior to Visit  Medication Sig Dispense Refill   CALCIUM-VITAMIN D PO Take by mouth.  Takes 650mg  po 2 a day (VIACTIVE)     Cetirizine HCl (ZYRTEC PO) Take 10 mg by mouth daily as needed.     nortriptyline (PAMELOR) 10 MG capsule Take 2 capsules (20 mg total) by mouth at bedtime. 180 capsule 3   rizatriptan (MAXALT-MLT) 10 MG disintegrating tablet Take 1 tablet (10 mg total) by mouth as needed for migraine. May repeat in 2 hours if needed 9 tablet 11   HYDROcodone-acetaminophen (NORCO) 10-325 MG tablet Take 0.5 tablets by mouth daily as needed.     pregabalin (LYRICA) 100 MG capsule Take 100 mg by mouth 2 (two) times daily.     No facility-administered medications prior to visit.    PAST MEDICAL HISTORY: Past Medical History:  Diagnosis Date   Bursitis    L shoulder   Chronic pain syndrome    Chronic shoulder pain    bilateral   Failed back syndrome    lumbar   Lumbar degenerative disc disease    Lumbosacral spondylosis    Migraine headache    Rotator cuff syndrome of left shoulder    Seasonal allergies     PAST SURGICAL HISTORY: Past Surgical History:  Procedure Laterality Date   ABDOMINAL HYSTERECTOMY     APPENDECTOMY     GANGLION CYST EXCISION  2003    LUMBAR LAMINECTOMY  2010   NASAL SEPTUM SURGERY     REFRACTIVE SURGERY     TUMOR REMOVAL     from arm   WISDOM TOOTH EXTRACTION      FAMILY HISTORY: Family History  Problem Relation Age of Onset   Ovarian cancer Mother    Colon cancer Father     SOCIAL HISTORY: Social History   Socioeconomic History   Marital status: Single    Spouse name: Not on file   Number of children: 0   Years of education: college   Highest education level: Associate degree: academic program  Occupational History   Occupation: Disabled  Tobacco Use   Smoking status: Never   Smokeless tobacco: Never  Substance and Sexual Activity   Alcohol use: Yes    Comment: very rare   Drug use: Never   Sexual activity: Not on file  Other Topics Concern   Not on file  Social History Narrative   Lives alone at home several cats.  DISABLED.  Education AAS degrees (2).  MTN DEW once daily. Right-handed.   Social Determinants of Health   Financial Resource Strain: Not on file  Food Insecurity: Not on file  Transportation Needs: Not on file  Physical Activity: Not on file  Stress: Not on file  Social Connections: Not on file  Intimate Partner Violence: Not on file     PHYSICAL EXAM  Vitals:   08/24/22 1306 08/24/22 1320  BP: (!) 177/93 (!) 144/88  Pulse: (!) 118 (!) 113  SpO2:  95%  Weight: 197 lb 9.6 oz (89.6 kg)   Height: 5' 5.5" (1.664 m)       Body mass index is 32.38 kg/m.  Generalized: Well developed, in no acute distress  Cardiology: normal rate and rhythm, no murmur noted Respiratory: clear to auscultation bilaterally  Neurological examination  Mentation: Alert oriented to time, place, history taking. Follows all commands speech and language fluent Cranial nerve II-XII: Pupils were equal round reactive to light. Extraocular movements were full, visual field were full  Motor: The motor testing reveals 5 over 5 strength of all 4 extremities. Good symmetric motor tone is noted  throughout.  Gait and station: Gait is normal.    DIAGNOSTIC DATA (LABS, IMAGING, TESTING) - I reviewed patient records, labs, notes, testing and imaging myself where available.      No data to display           Lab Results  Component Value Date   WBC 9.2 04/24/2008   HGB 11.7 (L) 04/24/2008   HCT 32.9 (L) 04/24/2008   MCV 89.6 04/24/2008   PLT 244 04/24/2008      Component Value Date/Time   NA 139 04/24/2008 0604   K 4.0 04/24/2008 0604   CL 99 04/24/2008 0604   CO2 32 04/24/2008 0604   GLUCOSE 120 (H) 04/24/2008 0604   BUN 10 04/24/2008 0604   CREATININE 0.84 04/24/2008 0604   CALCIUM 9.2 04/24/2008 0604   GFRNONAA >60 04/24/2008 0604   GFRAA  04/24/2008 0604    >60        The eGFR has been calculated using the MDRD equation. This calculation has not been validated in all clinical situations. eGFR's persistently <60 mL/min signify possible Chronic Kidney Disease.   No results found for: "CHOL", "HDL", "LDLCALC", "LDLDIRECT", "TRIG", "CHOLHDL" No results found for: "HGBA1C" No results found for: "VITAMINB12" No results found for: "TSH"   ASSESSMENT AND PLAN 62 y.o. year old female  has a past medical history of Bursitis, Chronic pain syndrome, Chronic shoulder pain, Failed back syndrome, Lumbar degenerative disc disease, Lumbosacral spondylosis, Migraine headache, Rotator cuff syndrome of left shoulder, and Seasonal allergies. here with     ICD-10-CM   1. Complex sleep apnea syndrome  G47.31 For home use only DME Bip[facility]    2. Recurrent headache  R51.9       Tracy Fox is doing well on ASV therapy. Compliance report reveals excellent daily and 4 hour compliance. Leak now acceptable. AHI is well managed. She was encouraged to continue using ASV nightly and for greater than 4 hours each night. Risks of untreated sleep apnea review and education materials provided. She will continue nortriptyline and rizatriptan for migraine management. I have encouraged her to  schedule visit with PCP to discuss blood pressure, pulse and weight gain. Healthy lifestyle habits encouraged. She will follow up in 1 year, sooner if needed. She verbalizes understanding and agreement with this plan.   Orders Placed This Encounter  Procedures   For home use only DME Bipap    Supplies    Order Specific Question:   Length of Need    Answer:   Lifetime    Order Specific Question:   Inspiratory pressure    Answer:   OTHER SEE COMMENTS    Order Specific Question:   Expiratory pressure    Answer:   OTHER SEE COMMENTS      No orders of the defined types were placed in this encounter.      Vena Bassinger, FNP-C 08/24/2022, 1:56 PM [facility] [address], [address] [place], [place] [address] [phone]

## 2022-09-03 ENCOUNTER — Encounter: Payer: Self-pay | Admitting: Family Medicine

## 2022-09-03 MED ORDER — NORTRIPTYLINE HCL 10 MG PO CAPS: 20.0000 mg | ORAL_CAPSULE | Freq: Every day | ORAL | 3 refills | Status: DC

## 2023-08-22 IMAGING — CR DG HIP (WITH OR WITHOUT PELVIS) 3-4V BILAT
3 series · 3 of 3 positions shown · non-contrast
Comparison: 01/22/2020

CLINICAL DATA: Back and bilateral hip pain

EXAM:
DG HIP (WITH OR WITHOUT PELVIS) 3-4V BILAT

[w pelvis upright]
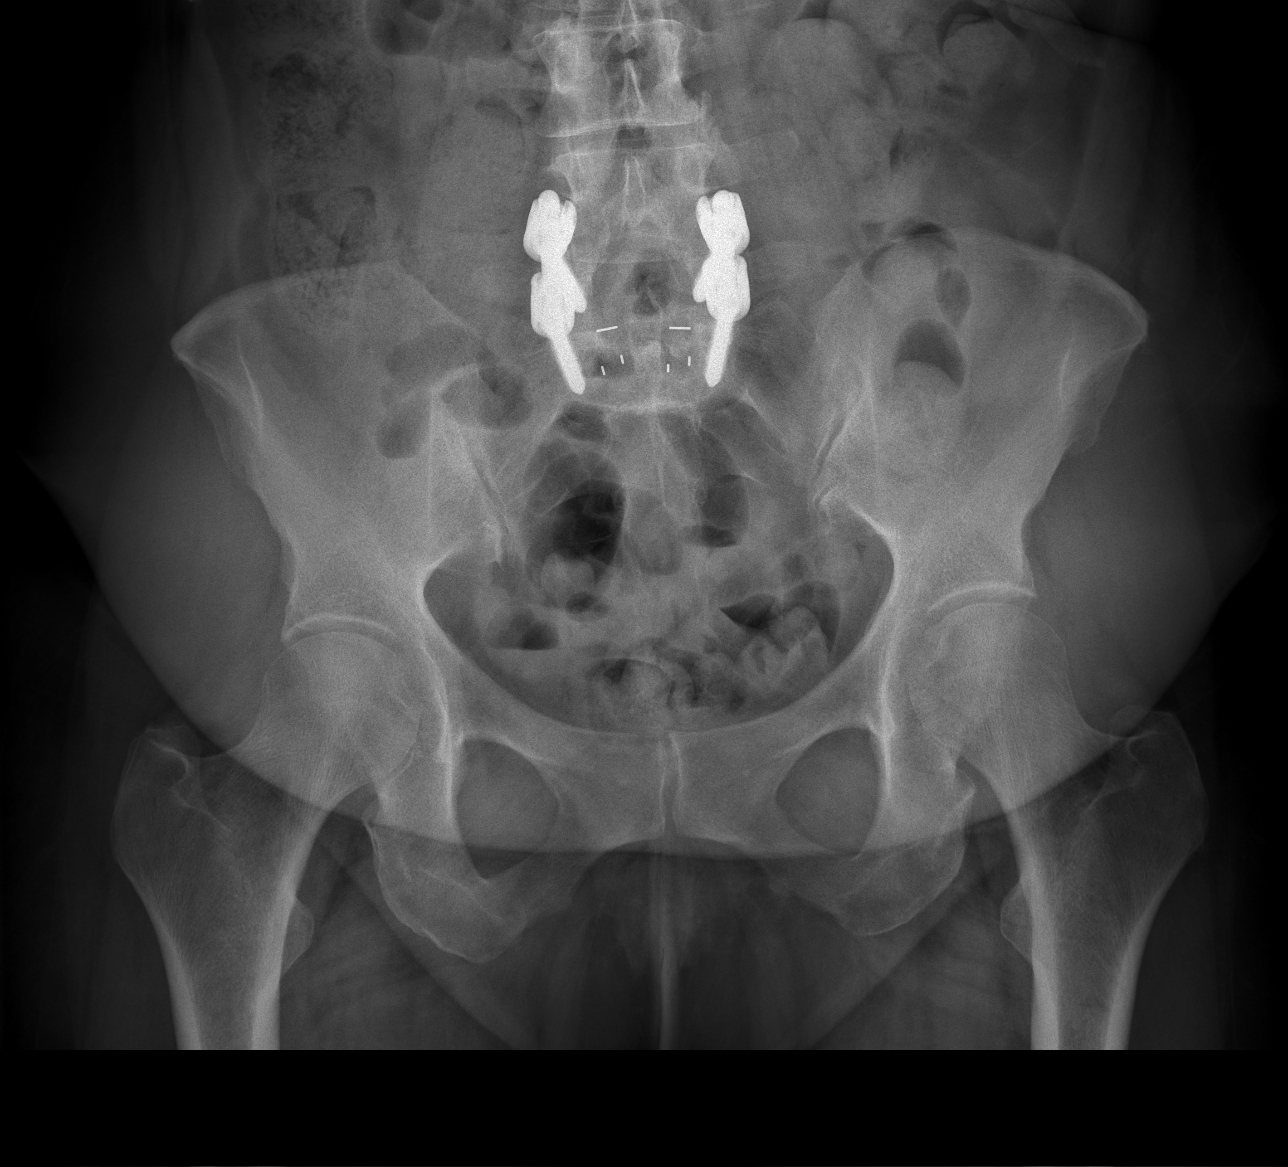

[w hip frog left]
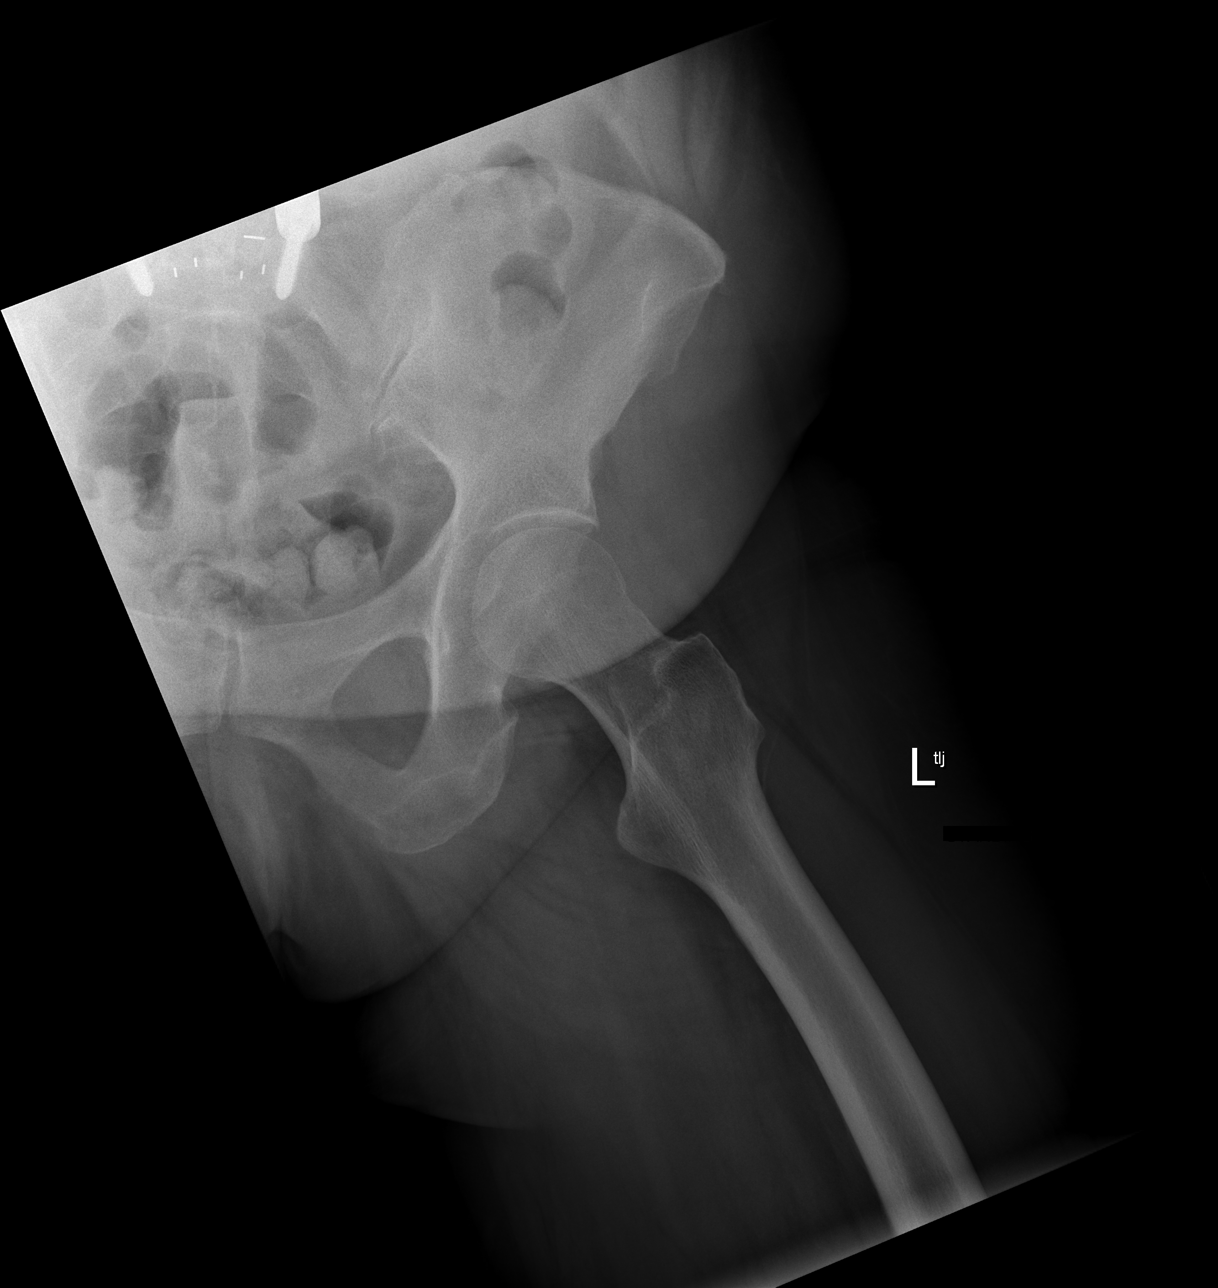

[w hip frog right]
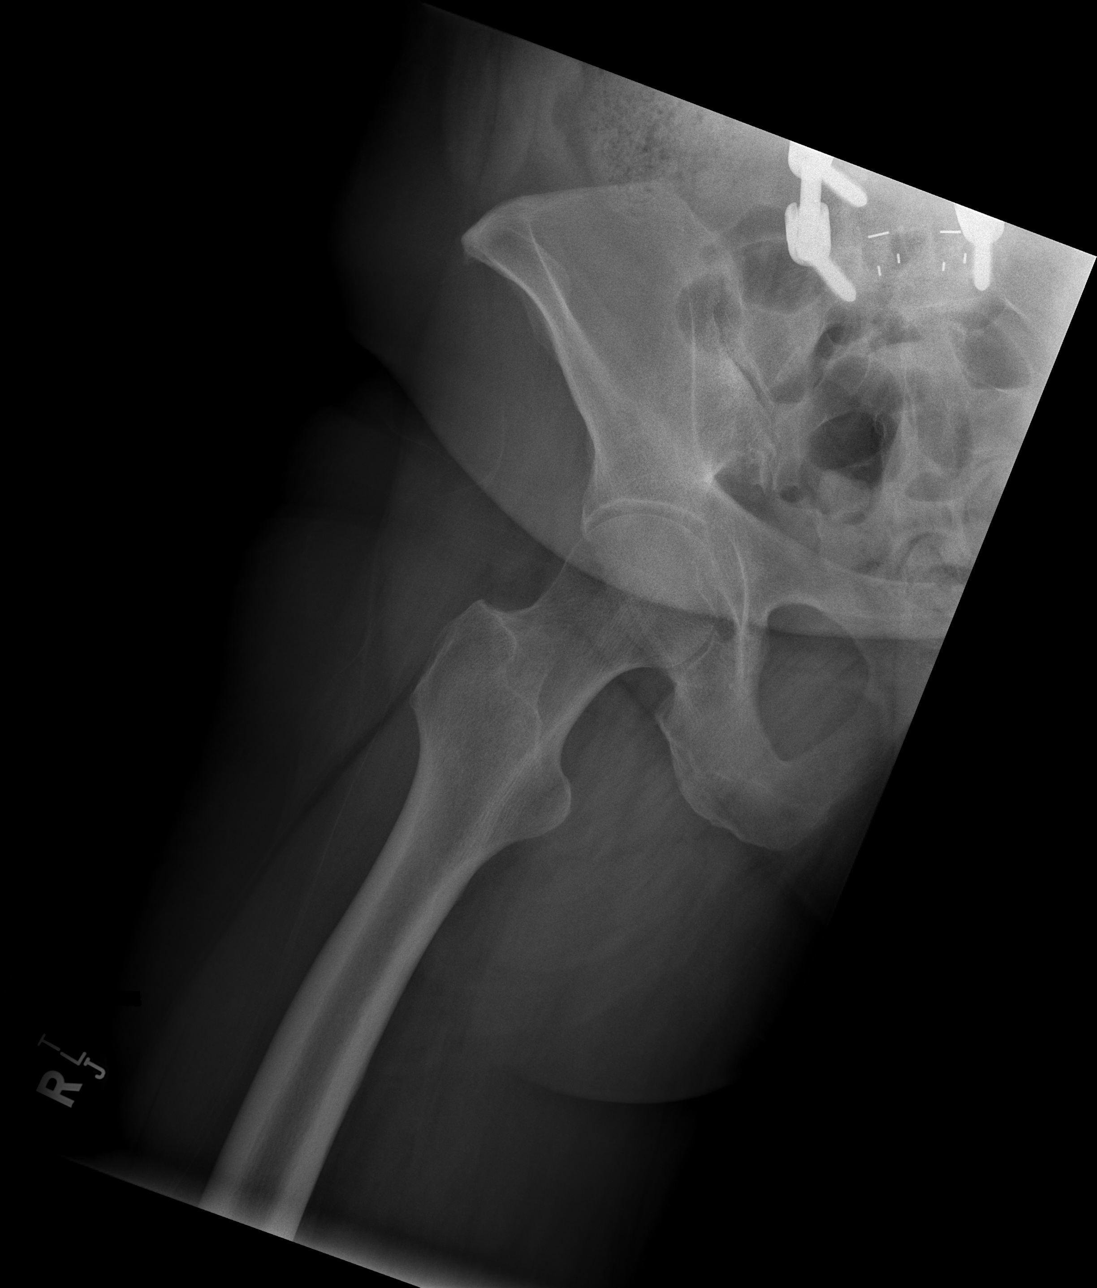

[3 of 3 positions shown; findings below may reference images not displayed]

FINDINGS: Bony pelvis and hips appear symmetric and intact. No acute osseous
finding, fracture, malalignment. No significant hip arthropathy.
Normal SI joints for age. Lower lumbar fusion noted. Nonobstructive
bowel gas pattern.
IMPRESSION: No acute abnormality.

## 2023-08-22 IMAGING — CR DG LUMBAR SPINE COMPLETE 4+V
5 series · 5 of 5 positions shown · non-contrast
Comparison: 06/02/2011

CLINICAL DATA: Chronic low back pain with sciatica

EXAM:
LUMBAR SPINE - COMPLETE 4+ VIEW

[w lumbar spine ap]
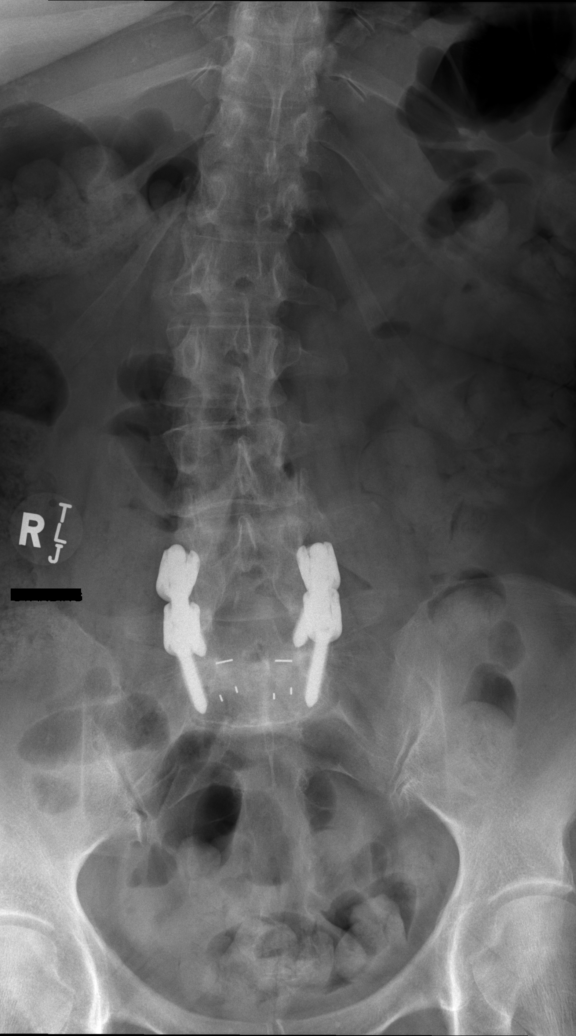

[w lumbar spine obl (1 of 2)]
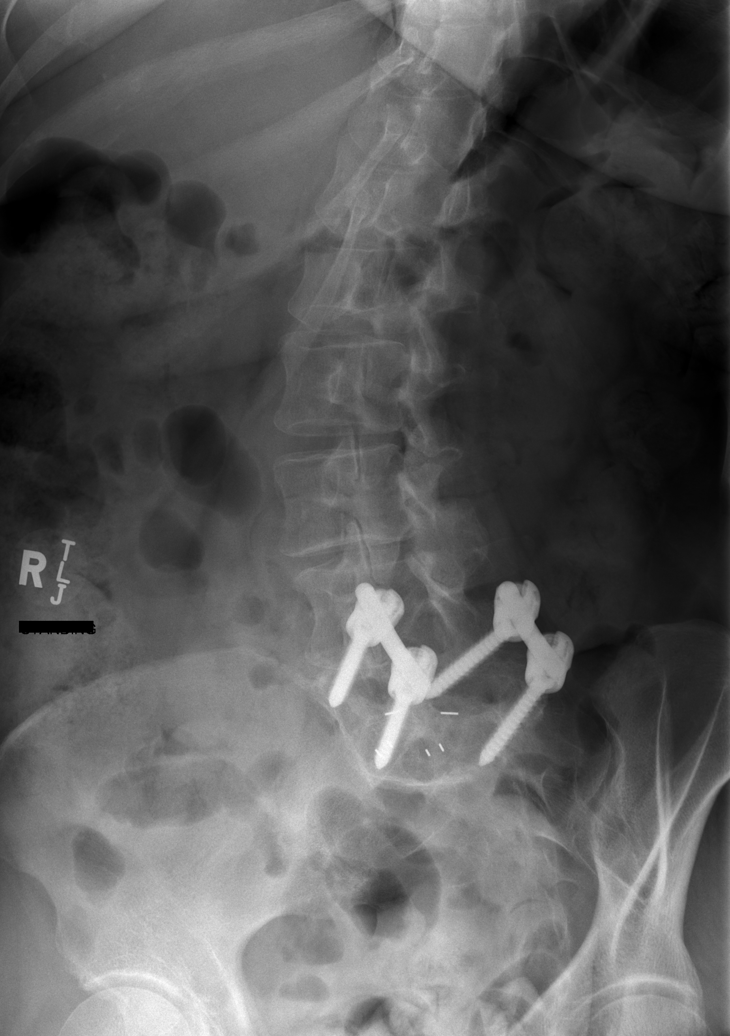

[w lumbar spine obl (2 of 2)]
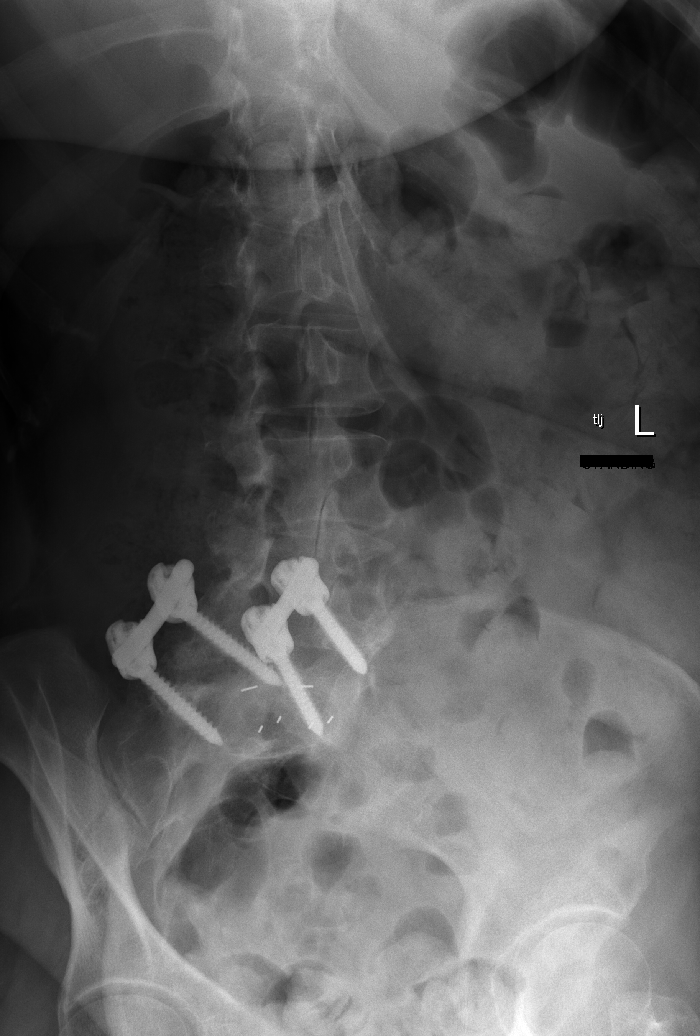

[w lumbar spine lat]
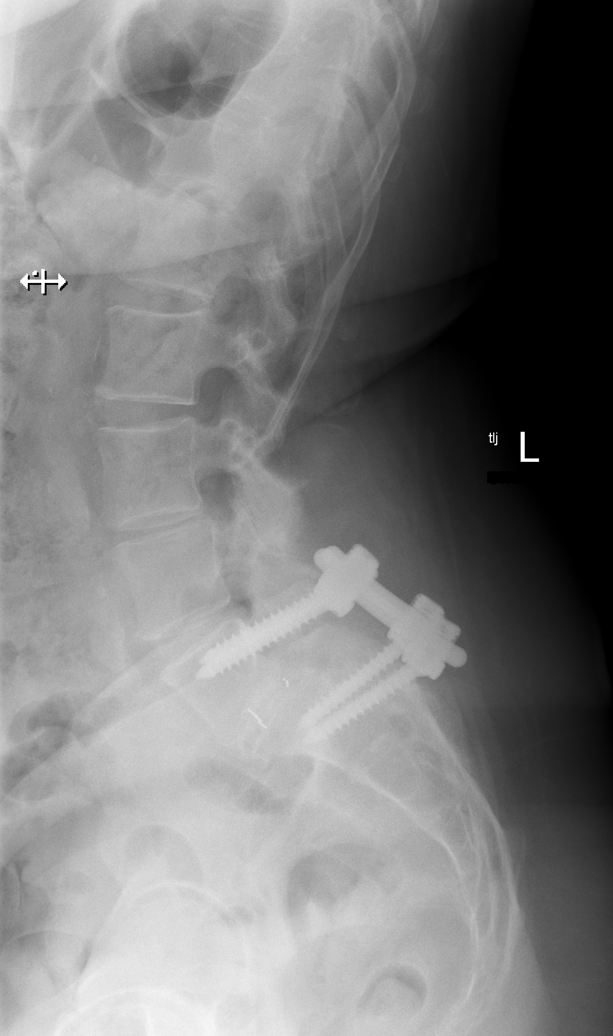

[w lumbar l-5 s-1 spot]
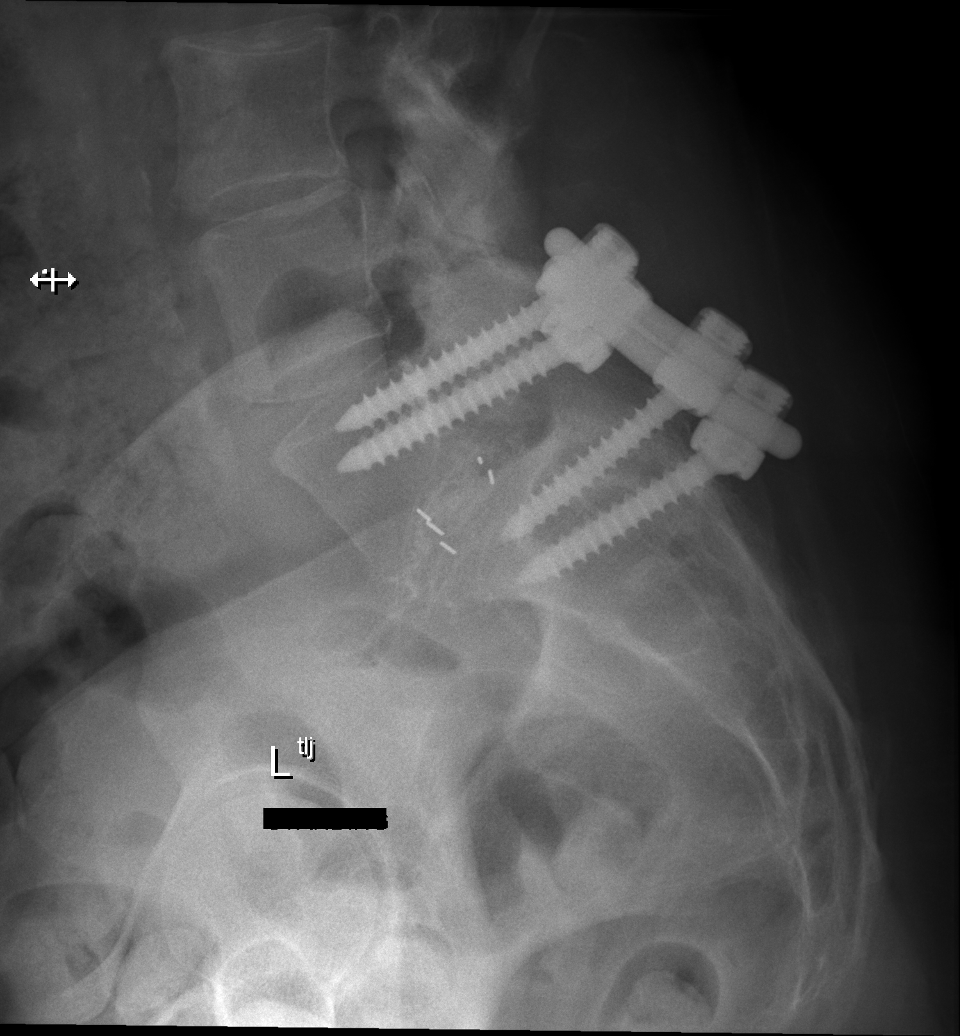

[5 of 5 positions shown; findings below may reference images not displayed]

FINDINGS: Slight increased dextrocurvature or scoliosis of the lower thoracic
and lumbar spine. Remote posterior fusion spanning L5-S1 with
interbody disc spacer. Stable alignment. Preserved vertebral body
heights. No acute compression fracture. No hardware abnormality.
Increased facet arthropathy above the hardware at L3-4. Mild SI
joint arthropathy, unchanged. Nonobstructive bowel gas pattern.
IMPRESSION: Remote posterior fusion spanning L5-S1.

Slight increased dextrocurvature of the lower thoracic and lumbar
spine may represent scoliosis.

Increased facet arthropathy at L3-4 posteriorly.

## 2023-08-23 NOTE — Patient Instructions (Signed)
 Below is our plan:  We will continue nortriptyline  20mg  daily and rizatriptan  as needed.   Please continue using your ASV regularly. While your insurance requires that you use ASV at least 4 hours each night on 70% of the nights, I recommend, that you not skip any nights and use it throughout the night if you can. Getting used to ASV and staying with the treatment long term does take time and patience and discipline. Untreated obstructive sleep apnea when it is moderate to severe can have an adverse impact on cardiovascular health and raise her risk for heart disease, arrhythmias, hypertension, congestive heart failure, stroke and diabetes. Untreated obstructive sleep apnea causes sleep disruption, nonrestorative sleep, and sleep deprivation. This can have an impact on your day to day functioning and cause daytime sleepiness and impairment of cognitive function, memory loss, mood disturbance, and problems focussing. Using ASV regularly can improve these symptoms.  We will update supply orders, today.   Please make sure you are staying well hydrated. I recommend 50-60 ounces daily. Well balanced diet and regular exercise encouraged. Consistent sleep schedule with 6-8 hours recommended.   Please continue follow up with care team as directed.   Follow up with me in 1 year   You may receive a survey regarding today's visit. I encourage you to leave honest feed back as I do use this information to improve patient care. Thank you for seeing me today!

## 2023-08-23 NOTE — Progress Notes (Unsigned)
 Tracy Fox

## 2023-08-23 NOTE — Progress Notes (Unsigned)
 PATIENT: Tracy Fox DOB: October 16, 1960  REASON FOR VISIT: follow up HISTORY FROM: patient  Virtual Visit via MyChart video  I connected with Tracy Fox on 08/24/23 at  9:45 AM EDT via MyChart video and verified that I am speaking with the correct person using two identifiers.   I discussed the limitations, risks, security and privacy concerns of performing an evaluation and management service by Mychart video and the availability of in person appointments. I also discussed with the patient that there may be a patient responsible charge related to this service. The patient expressed understanding and agreed to proceed.   History of Present Illness:  08/24/23 ALL (MyChart): Tracy Fox is a 63 y.o. female here today for follow up for OSA on ASV and headaches. She continues to do well. She is taking nortriptyline  20mg  daily and rizatriptan  as needed. She does endorse more headaches over the past few months. She blames this on stress and anxiety of watching the news. She is not getting out much due to the heat. She lives alone. She takes care of outdoor cats. She has not seen PCP, recently. She is using ASV regularly. No concerns with machine or supplies.     08/24/22 ALL:  Tracy Fox continues for follow up for OSA on ASV and headaches. She was last seen 08/2021 and doing well on nortriptyline  20mg  at bedtime and rizatriptan  PRN. Since, she reports doing well. She did have an increase in headache days around the beginning of the year but they seems to have stabilized. She continues to tolerate meds well. She has not used rizatriptan  recently. She has gained sone weight. She contributes this to stress and inactivity. She has not seen PCP. She does not routinely get physicals. BP is usually 110-120/80's but has been elevated since weight gain. She feels pulse is elevated due to heat. She tries to avoid going outside when it is hot. She stays well hydrated. She denies chest pain or shortness of breath.       Observations/Objective:  Generalized: Well developed, in no acute distress  Mentation: Alert oriented to time, place, history taking. Follows all commands speech and language fluent   Assessment and Plan:  63 y.o. year old female  has a past medical history of Bursitis, Chronic pain syndrome, Chronic shoulder pain, Failed back syndrome, Lumbar degenerative disc disease, Lumbosacral spondylosis, Migraine headache, Rotator cuff syndrome of left shoulder, and Seasonal allergies. here with    ICD-10-CM   1. Complex sleep apnea syndrome  G47.39 For home use only DME Bipap    2. Recurrent headache  R51.9       Tracy Fox is doing well on ASV therapy. Compliance report reveals excellent daily and 4 hour compliance. Leak now acceptable. AHI is well managed. She was encouraged to continue using ASV nightly and for greater than 4 hours each night. Risks of untreated sleep apnea review and education materials provided. She will continue nortriptyline  and rizatriptan  for migraine management. Encouraged to increase activity and reduce stress. Advised to follow up with PCP regularly. Continue follow up with care team as directed. Healthy lifestyle habits encouraged. She will follow up in 1 year, sooner if needed. She verbalizes understanding and agreement with this plan.    Orders Placed This Encounter  Procedures   For home use only DME Bipap    Supplies    Length of Need:   Lifetime    Inspiratory pressure:   OTHER SEE COMMENTS  Expiratory pressure:   OTHER SEE COMMENTS    Meds ordered this encounter  Medications   nortriptyline  (PAMELOR ) 10 MG capsule    Sig: Take 2 capsules (20 mg total) by mouth at bedtime.    Dispense:  180 capsule    Refill:  3    Supervising Provider:   AHERN, ANTONIA B [[id number]]   rizatriptan  (MAXALT -MLT) 10 MG disintegrating tablet    Sig: Take 1 tablet (10 mg total) by mouth as needed for migraine. May repeat in 2 hours if needed    Dispense:  9 tablet     Refill:  11    Supervising Provider:   AHERN, ANTONIA B [[id number]]     Follow Up Instructions:  I discussed the assessment and treatment plan with the patient. The patient was provided an opportunity to ask questions and all were answered. The patient agreed with the plan and demonstrated an understanding of the instructions.   The patient was advised to call back or seek an in-person evaluation if the symptoms worsen or if the condition fails to improve as anticipated.  I provided 15 minutes of face-to-face and non face-to-face time during this MyChart video encounter. Patient located at their place of residence. Provider is in the office.    Tracy Ganim, NP

## 2023-08-24 ENCOUNTER — Telehealth (INDEPENDENT_AMBULATORY_CARE_PROVIDER_SITE_OTHER): Payer: Medicare Other | Admitting: Family Medicine

## 2023-08-24 ENCOUNTER — Encounter: Payer: Self-pay | Admitting: Family Medicine

## 2023-08-24 DIAGNOSIS — G4739 Other sleep apnea: Secondary | ICD-10-CM | POA: Diagnosis not present

## 2023-08-24 DIAGNOSIS — R519 Headache, unspecified: Secondary | ICD-10-CM | POA: Diagnosis not present

## 2023-08-24 MED ORDER — NORTRIPTYLINE HCL 10 MG PO CAPS: 20.0000 mg | ORAL_CAPSULE | Freq: Every day | ORAL | 3 refills | Status: AC

## 2023-08-24 MED ORDER — RIZATRIPTAN BENZOATE 10 MG PO TBDP: 10.0000 mg | ORAL_TABLET | ORAL | 11 refills | Status: AC | PRN

## 2024-08-28 ENCOUNTER — Telehealth: Admitting: Family Medicine

## 2024-08-30 ENCOUNTER — Telehealth: Admitting: Family Medicine
